# Patient Record
Sex: Male | Born: 1940 | Race: White | Hispanic: No | Marital: Married | Smoking: Former smoker
Health system: Southern US, Community
[De-identification: ages and names within clinical notes are randomized; demographics above are authoritative.]

## PROBLEM LIST (undated history)

## (undated) DIAGNOSIS — I10 Essential (primary) hypertension: Secondary | ICD-10-CM

## (undated) DIAGNOSIS — I639 Cerebral infarction, unspecified: Secondary | ICD-10-CM

## (undated) HISTORY — PX: HERNIA REPAIR: SHX51

## (undated) HISTORY — DX: Cerebral infarction, unspecified: I63.9

## (undated) HISTORY — DX: Essential (primary) hypertension: I10

---

## 2009-11-07 ENCOUNTER — Emergency Department (HOSPITAL_COMMUNITY): Admission: EM | Admit: 2009-11-07 | Discharge: 2009-11-07 | Payer: Self-pay | Admitting: Emergency Medicine

## 2010-10-21 LAB — URINALYSIS, ROUTINE W REFLEX MICROSCOPIC
Ketones, ur: 15 mg/dL — AB
Nitrite: POSITIVE — AB
Protein, ur: >300 mg/dL — AB
Urobilinogen, UA: 1 mg/dL (ref 0.0–1.0)
pH: 5.5 (ref 5.0–8.0)

## 2010-10-21 LAB — BASIC METABOLIC PANEL
Chloride: 108 mEq/L (ref 96–112)
Creatinine, Ser: 0.95 mg/dL (ref 0.4–1.5)
GFR calc Af Amer: >60 mL/min (ref >60)
Sodium: 141 mEq/L (ref 135–145)

## 2010-10-21 LAB — CBC
MCV: 87 fL (ref 78.0–100.0)
RBC: 5.14 MIL/uL (ref 4.22–5.81)
WBC: 6.1 10*3/uL (ref 4.0–10.5)

## 2010-10-21 LAB — URINE MICROSCOPIC-ADD ON

## 2012-01-10 DIAGNOSIS — R55 Syncope and collapse: Secondary | ICD-10-CM

## 2012-01-10 DIAGNOSIS — I2510 Atherosclerotic heart disease of native coronary artery without angina pectoris: Secondary | ICD-10-CM | POA: Insufficient documentation

## 2012-01-10 HISTORY — DX: Syncope and collapse: R55

## 2012-01-11 HISTORY — PX: CORONARY ARTERY BYPASS GRAFT: SHX141

## 2012-01-13 DIAGNOSIS — I462 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: Secondary | ICD-10-CM | POA: Insufficient documentation

## 2012-01-13 DIAGNOSIS — I4891 Unspecified atrial fibrillation: Secondary | ICD-10-CM

## 2012-01-13 HISTORY — DX: 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: I46.2

## 2012-01-13 HISTORY — DX: Unspecified atrial fibrillation: I48.91

## 2012-02-21 DIAGNOSIS — I429 Cardiomyopathy, unspecified: Secondary | ICD-10-CM

## 2012-02-21 HISTORY — DX: Cardiomyopathy, unspecified: I42.9

## 2013-12-18 DIAGNOSIS — I10 Essential (primary) hypertension: Secondary | ICD-10-CM | POA: Insufficient documentation

## 2013-12-18 DIAGNOSIS — E785 Hyperlipidemia, unspecified: Secondary | ICD-10-CM | POA: Insufficient documentation

## 2014-03-02 DEATH — deceased

## 2016-03-27 DIAGNOSIS — R42 Dizziness and giddiness: Secondary | ICD-10-CM | POA: Insufficient documentation

## 2016-04-21 ENCOUNTER — Telehealth: Payer: Self-pay

## 2016-04-22 NOTE — Telephone Encounter (Signed)
x

## 2019-08-27 ENCOUNTER — Other Ambulatory Visit: Payer: Self-pay

## 2019-08-27 ENCOUNTER — Ambulatory Visit (INDEPENDENT_AMBULATORY_CARE_PROVIDER_SITE_OTHER): Payer: Medicare Other | Admitting: Family Medicine

## 2019-08-27 ENCOUNTER — Encounter: Payer: Self-pay | Admitting: Family Medicine

## 2019-08-27 VITALS — BP 136/78 | HR 96 | Temp 98.2°F | Resp 20 | Ht 72.0 in | Wt 148.0 lb

## 2019-08-27 DIAGNOSIS — I480 Paroxysmal atrial fibrillation: Secondary | ICD-10-CM

## 2019-08-27 DIAGNOSIS — I2510 Atherosclerotic heart disease of native coronary artery without angina pectoris: Secondary | ICD-10-CM | POA: Diagnosis not present

## 2019-08-27 DIAGNOSIS — R351 Nocturia: Secondary | ICD-10-CM

## 2019-08-27 DIAGNOSIS — I10 Essential (primary) hypertension: Secondary | ICD-10-CM | POA: Diagnosis not present

## 2019-08-27 DIAGNOSIS — E782 Mixed hyperlipidemia: Secondary | ICD-10-CM

## 2019-08-27 DIAGNOSIS — N401 Enlarged prostate with lower urinary tract symptoms: Secondary | ICD-10-CM

## 2019-08-27 NOTE — Progress Notes (Signed)
Subjective:  Patient ID: Stephen Cross, male    DOB: Jan 31, 1941, 79 y.o.   MRN: [id number]  Patient Care Team: Baruch Gouty, FNP as PCP - General (Family Medicine)   Chief Complaint:  Establish Care ([place] )   HPI: Stephen Cross is a 79 y.o. male presenting on 08/27/2019 for Establish Care ([place] )   79 year old male presents today to establish care with new provider as his provider has retired.  Patient reports he is fairly healthy.  States he only takes pravastatin and aspirin daily.  Upon review of EHR, it is noted that patient has had a four-vessel CABG, Fr[place] coronary artery disease, hyperlipidemia enlarged prostate, cardiomyopathy, hypertension, atrial fibrillation, GERD, and intermittent dizziness.  Patient reports he has not seen cardiology in over a year.  States he was released by them.  Last cardiology note reported patient was to be on beta-blockers, statin therapy, and aspirin therapy.  Patient reports he has not taken anything for his blood pressure in a long time as it has not been required.  Patient is followed by the [facility].  Does see them on a regular basis.  No notes available from the [facility].  Patient states he does have intermittent dizziness.  States this occurs with quick positional changes.  States it only lasts for a brief second and then resolves.  He reports he has been on meclizine in the past for this with great control of symptoms.  He denies any chest pain, shortness of breath, weakness, confusion, headaches, focal deficits, or loss of function.  No syncopal episodes.  No palpitations or fatigue.  Patient's heart rate and blood pressure noted to be elevated in office today with some irregularities to his heart rate.  Patient reports he does not feel the irregularity in his heart rate and denies symptoms.  Patient does report he does drink excessive amounts of caffeine throughout the day.  States he drinks at least 8 to 10 cups of coffee per day.  Denies other  stimulant use.   Relevant past medical, surgical, family, and social history reviewed and updated as indicated.  Allergies and medications reviewed and updated. Date reviewed: Chart in Epic.   Past Medical History:  Diagnosis Date  . 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 ([place]) 01/13/2012   Overview:  PEA arrest on induction  . Syncope, cardiogenic 01/10/2012    Past Surgical History:  Procedure Laterality Date  . HERNIA REPAIR     x 2    Social History   Socioeconomic History  . Marital status: Married    Spouse name: patricia  . Number of children: 4  . Years of education: Not on file  . Highest education level: Not on file  Occupational History  . Occupation: retired   Tobacco Use  . Smoking status: Former Research scientist (life sciences)  . Smokeless tobacco: Never Used  Substance and Sexual Activity  . Alcohol use: Never  . Drug use: Never  . Sexual activity: Not on file  Other Topics Concern  . Not on file  Social History Narrative  . Not on file   Social Determinants of Health   Financial Resource Strain:   . Difficulty of Paying Living Expenses: Not on file  Food Insecurity:   . Worried About Charity fundraiser in the Last Year: Not on file  . Ran Out of Food in the Last Year: Not on file  Transportation Needs:   . Lack of  Transportation (Medical): Not on file  . Lack of Transportation (Non-Medical): Not on file  Physical Activity:   . Days of Exercise per Week: Not on file  . Minutes of Exercise per Session: Not on file  Stress:   . Feeling of Stress : Not on file  Social Connections:   . Frequency of Communication with Friends and Family: Not on file  . Frequency of Social Gatherings with Friends and Family: Not on file  . Attends Religious Services: Not on file  . Active Member of Clubs or Organizations: Not on file  . Attends Archivist Meetings: Not on file  . Marital Status: Not on file  Intimate Partner Violence:   . Fear of Current or  Ex-Partner: Not on file  . Emotionally Abused: Not on file  . Physically Abused: Not on file  . Sexually Abused: Not on file    Outpatient Encounter Medications as of 08/27/2019  Medication Sig  . pravastatin (PRAVACHOL) 80 MG tablet Take 1 tablet by mouth daily.  Marland Kitchen aspirin 81 MG EC tablet Take 1 tablet by mouth daily.  . meclizine (ANTIVERT) 25 MG tablet Take 25 mg by mouth 3 (three) times daily as needed for dizziness.   No facility-administered encounter medications on file as of 08/27/2019.    Allergies  Allergen Reactions  . Codeine Nausea Only and Rash  . Lisinopril Cough and Other (See Comments)    Review of Systems  Constitutional: Negative for activity change, appetite change, chills, diaphoresis, fatigue, fever and unexpected weight change.  HENT: Negative.   Eyes: Negative.  Negative for photophobia and visual disturbance.  Respiratory: Negative for cough, chest tightness, shortness of breath and wheezing.   Cardiovascular: Negative for chest pain, palpitations and leg swelling.  Gastrointestinal: Negative for abdominal pain, anal bleeding, blood in stool, constipation, diarrhea, nausea and vomiting.  Endocrine: Negative.  Negative for polydipsia, polyphagia and polyuria.  Genitourinary: Negative for decreased urine volume, difficulty urinating, discharge, dysuria, enuresis, flank pain, frequency, genital sores, hematuria, penile pain, penile swelling, scrotal swelling, testicular pain and urgency.       Nocturia  Musculoskeletal: Negative for arthralgias and myalgias.  Skin: Negative.  Negative for color change and rash.  Allergic/Immunologic: Negative.   Neurological: Positive for dizziness. Negative for tremors, seizures, syncope, facial asymmetry, speech difficulty, weakness, light-headedness, numbness and headaches.  Hematological: Negative.   Psychiatric/Behavioral: Negative for confusion, hallucinations, sleep disturbance and suicidal ideas.  All other systems  reviewed and are negative.       Objective:  BP 136/78 (BP Location: Left Arm, Cuff Size: Normal)   Pulse 96   Temp 98.2 F (36.8 C)   Resp 20   Ht 6' (1.829 m)   Wt 148 lb (67.1 kg)   SpO2 94%   BMI 20.07 kg/m    Wt Readings from Last 3 Encounters:  08/27/19 148 lb (67.1 kg)    Physical Exam Vitals and nursing note reviewed.  Constitutional:      General: He is not in acute distress.    Appearance: Normal appearance. He is well-developed, well-groomed and normal weight. He is not ill-appearing, toxic-appearing or diaphoretic.  HENT:     Head: Normocephalic and atraumatic.     Jaw: There is normal jaw occlusion.     Right Ear: Hearing, tympanic membrane, ear canal and external ear normal.     Left Ear: Hearing, tympanic membrane, ear canal and external ear normal.     Nose: Nose normal.  Mouth/Throat:     Lips: Pink.     Mouth: Mucous membranes are moist.     Pharynx: Oropharynx is clear. Uvula midline.  Eyes:     General: Lids are normal.     Extraocular Movements: Extraocular movements intact.     Right eye: No nystagmus.     Left eye: No nystagmus.     Conjunctiva/sclera: Conjunctivae normal.     Pupils: Pupils are equal, round, and reactive to light.  Neck:     Thyroid: No thyroid mass, thyromegaly or thyroid tenderness.     Vascular: No carotid bruit or JVD.     Trachea: Trachea and phonation normal.  Cardiovascular:     Rate and Rhythm: Normal rate. Occasional extrasystoles are present.    Chest Wall: PMI is not displaced.     Pulses: Normal pulses.     Heart sounds: Normal heart sounds. No murmur. No friction rub. No gallop.   Pulmonary:     Effort: Pulmonary effort is normal. No respiratory distress.     Breath sounds: Normal breath sounds. No wheezing.  Abdominal:     General: Bowel sounds are normal. There is no distension or abdominal bruit.     Palpations: Abdomen is soft. There is no hepatomegaly or splenomegaly.     Tenderness: There is no  abdominal tenderness. There is no right CVA tenderness or left CVA tenderness.     Hernia: No hernia is present.  Musculoskeletal:        General: Normal range of motion.     Cervical back: Normal range of motion and neck supple.     Right lower leg: No edema.     Left lower leg: No edema.  Lymphadenopathy:     Cervical: No cervical adenopathy.  Skin:    General: Skin is warm and dry.     Capillary Refill: Capillary refill takes less than 2 seconds.     Coloration: Skin is not cyanotic, jaundiced or pale.     Findings: No rash.  Neurological:     General: No focal deficit present.     Mental Status: He is alert and oriented to person, place, and time.     Cranial Nerves: Cranial nerves are intact. No cranial nerve deficit.     Sensory: Sensation is intact. No sensory deficit.     Motor: Motor function is intact. No weakness.     Coordination: Coordination is intact. Coordination normal.     Gait: Gait is intact. Gait normal.     Deep Tendon Reflexes: Reflexes are normal and symmetric. Reflexes normal.  Psychiatric:        Attention and Perception: Attention and perception normal.        Mood and Affect: Mood and affect normal.        Speech: Speech normal.        Behavior: Behavior normal. Behavior is cooperative.        Thought Content: Thought content normal.        Cognition and Memory: Cognition and memory normal.        Judgment: Judgment normal.     Results for orders placed or performed during the hospital encounter of 11/07/09  Urinalysis, Routine w reflex microscopic  Result Value Ref Range   Color, Urine RED BIOCHEMICALS MAY BE AFFECTED BY COLOR (A) YELLOW   APPearance CLOUDY (A) CLEAR   Specific Gravity, Urine 1.025 1.005 - 1.030   pH 5.5 5.0 - 8.0   Glucose, UA NEGATIVE  NEGATIVE mg/dL   Hgb urine dipstick LARGE (A) NEGATIVE   Bilirubin Urine SMALL (A) NEGATIVE   Ketones, ur 15 (A) NEGATIVE mg/dL   Protein, ur >300 (A) NEGATIVE mg/dL   Urobilinogen, UA 1.0 0.0  - 1.0 mg/dL   Nitrite POSITIVE (A) NEGATIVE   Leukocytes, UA TRACE (A) NEGATIVE  Urine microscopic-add on  Result Value Ref Range   Squamous Epithelial / LPF RARE RARE   WBC, UA 0-2 <3 WBC/hpf   RBC / HPF TOO NUMEROUS TO COUNT <3 RBC/hpf   Bacteria, UA FEW (A) RARE   Casts HYALINE CASTS (A) NEGATIVE   Urine-Other      MUCOUS PRESENT URINALYSIS PERFORMED ON SUPERNATANT  Basic metabolic panel  Result Value Ref Range   Sodium 141 135 - 145 mEq/L   Potassium 4.5 3.5 - 5.1 mEq/L   Chloride 108 96 - 112 mEq/L   CO2 28 19 - 32 mEq/L   Glucose, Bld 103 (H) 70 - 99 mg/dL   BUN 19 6 - 23 mg/dL   Creatinine, Ser 0.95 0.4 - 1.5 mg/dL   Calcium 8.9 8.4 - 10.5 mg/dL   GFR calc non Af Amer >60 >60 mL/min   GFR calc Af Amer  >60 mL/min    >60        The eGFR has been calculated using the MDRD equation. This calculation has not been validated in all clinical situations. eGFR's persistently <60 mL/min signify possible Chronic Kidney Disease.  CBC  Result Value Ref Range   WBC 6.1 4.0 - 10.5 K/uL   RBC 5.14 4.22 - 5.81 MIL/uL   Hemoglobin 15.1 13.0 - 17.0 g/dL   HCT 44.8 39.0 - 52.0 %   MCV 87.0 78.0 - 100.0 fL   MCHC 33.7 30.0 - 36.0 g/dL   RDW 13.8 11.5 - 15.5 %   Platelets 186 150 - 400 K/uL    EKG: Sinus rhythm with PVCs.  No ST depression or elevation.  No previous EKG for comparison. Monia Pouch, FNP-C  Pertinent labs & imaging results that were available during my care of the patient were reviewed by me and considered in my medical decision making.  Assessment & Plan:  Stephen Cross was seen today for establish care.  Diagnoses and all orders for this visit:  Mixed hyperlipidemia Diet encouraged - increase intake of fresh fruits and vegetables, increase intake of lean proteins. Bake, broil, or grill foods. Avoid fried, greasy, and fatty foods. Avoid fast foods. Increase intake of fiber-rich whole grains. Exercise encouraged - at least 150 minutes per week and advance as  tolerated.  Goal BMI < 25. Continue medications as prescribed. Follow up in 3-6 months as discussed.  -     Lipid panel  Essential hypertension Initial blood pressure in office elevated at 174/88.  Manual recheck 136/78.  Patient reports normal blood pressure readings at home.  Patient states he is asymptomatic.  Will reevaluate and discuss initiating medications if blood pressure remains elevated.  Labs pending. -     CMP14+EGFR -     CBC with Differential/Platelet -     Lipid panel -     Thyroid Panel With TSH  Chronic coronary artery disease Labs pending.  Patient refused referral to cardiology at this time.  States he will speak to the doctors at the [facility] about this. -     CMP14+EGFR -     CBC with Differential/Platelet -     Lipid panel  Paroxysmal atrial fibrillation (HCC) EKG  in office with PVCs.  Patient asymptomatic.  Patient advised to limit caffeine intake.  Patient aware of symptoms that quired emergent evaluation and treatment.  Patient aware to report any new, worsening, or persistent symptoms.  Labs pending. -     EKG 12-Lead -     CMP14+EGFR -     CBC with Differential/Platelet  Benign prostatic hyperplasia with nocturia Has had an elevated PSA in the past.  We will recheck today. -     PSA, total and free     Continue all other maintenance medications.  Follow up plan: Return in about 6 months (around 02/24/2020), or if symptoms worsen or fail to improve, for Hyperlipidemia, HTN.  Continue healthy lifestyle choices, including diet (rich in fruits, vegetables, and lean proteins, and low in salt and simple carbohydrates) and exercise (at least 30 minutes of moderate physical activity daily).  Educational handout given for survey and COVID-19  The above assessment and management plan was discussed with the patient. The patient verbalized understanding of and has agreed to the management plan. Patient is aware to call the clinic if they develop any new symptoms or if  symptoms persist or worsen. Patient is aware when to return to the clinic for a follow-up visit. Patient educated on when it is appropriate to go to the emergency department.   Monia Pouch, FNP-C [facility] [phone]

## 2019-08-27 NOTE — Patient Instructions (Signed)
It was a pleasure seeing you today, Stephen Cross.  Information regarding what we discussed is included in this packet.  Please make an appointment to see me in 6 months.    If you had labs performed today, you will be contacted with the abnormal results once they are available, usually in the next 3 business days for routine lab work.  If you had STI testing, a pap smear, or a biopsy performed, expect to be contacted in about 7-10 days. If results are normal, you will not be notified.    In a few days you may receive a survey in the mail or online from [employer] regarding your visit with [place] today. Please take a moment to fill this out. Your feedback is very important to our office. It can help [place] better understand your needs as well as improve your experience and satisfaction. Thank you for taking your time to complete it. We care about you.  Because of recent events of COVID-19 ("Coronavirus"), please follow CDC recommendations:   1. Wash your hand frequently 2. Avoid touching your face 3. Stay away from people who are sick 4. If you have symptoms such as fever, cough, shortness of breath then call your healthcare provider for further guidance 5. If you are sick, STAY AT HOME, unless otherwise directed by your healthcare provider. 6. Follow directions from state and national officials regarding staying safe    Please feel free to call our office if any questions or concerns arise.  Warm Regards, Kari Baars, FNP-C [facility] [address] [place], [place] [address] [phone]

## 2019-08-28 ENCOUNTER — Telehealth: Payer: Self-pay | Admitting: Family Medicine

## 2019-08-28 ENCOUNTER — Telehealth: Payer: Self-pay | Admitting: *Deleted

## 2019-08-28 LAB — PSA, TOTAL AND FREE
PSA, Free Pct: 39.1 %
PSA, Free: 1.84 ng/mL
Prostate Specific Ag, Serum: 4.7 ng/mL — ABNORMAL HIGH (ref 0.0–4.0)

## 2019-08-28 LAB — CBC WITH DIFFERENTIAL/PLATELET
Basophils Absolute: 0.1 10*3/uL (ref 0.0–0.2)
Basos: 1 %
EOS (ABSOLUTE): 0.8 10*3/uL — ABNORMAL HIGH (ref 0.0–0.4)
Eos: 9 %
Hematocrit: 47.3 % (ref 37.5–51.0)
Hemoglobin: 16.1 g/dL (ref 13.0–17.7)
Immature Grans (Abs): 0.1 10*3/uL (ref 0.0–0.1)
Immature Granulocytes: 1 %
Lymphocytes Absolute: 1.5 10*3/uL (ref 0.7–3.1)
Lymphs: 17 %
MCH: 28.8 pg (ref 26.6–33.0)
MCHC: 34 g/dL (ref 31.5–35.7)
MCV: 85 fL (ref 79–97)
Monocytes Absolute: 0.9 10*3/uL (ref 0.1–0.9)
Monocytes: 10 %
Neutrophils Absolute: 5.7 10*3/uL (ref 1.4–7.0)
Neutrophils: 62 %
Platelets: 214 10*3/uL (ref 150–450)
RBC: 5.59 x10E6/uL (ref 4.14–5.80)
RDW: 12.5 % (ref 11.6–15.4)
WBC: 9.2 10*3/uL (ref 3.4–10.8)

## 2019-08-28 LAB — CMP14+EGFR
ALT: 6 IU/L (ref 0–44)
AST: 13 IU/L (ref 0–40)
Albumin/Globulin Ratio: 1.7 (ref 1.2–2.2)
Albumin: 4.5 g/dL (ref 3.7–4.7)
Alkaline Phosphatase: 84 IU/L (ref 39–117)
BUN/Creatinine Ratio: 19 (ref 10–24)
BUN: 18 mg/dL (ref 8–27)
Bilirubin Total: 0.5 mg/dL (ref 0.0–1.2)
CO2: 25 mmol/L (ref 20–29)
Calcium: 9.6 mg/dL (ref 8.6–10.2)
Chloride: 104 mmol/L (ref 96–106)
Creatinine, Ser: 0.94 mg/dL (ref 0.76–1.27)
GFR calc Af Amer: 89 mL/min/{1.73_m2} (ref >59)
GFR calc non Af Amer: 77 mL/min/{1.73_m2} (ref >59)
Globulin, Total: 2.6 g/dL (ref 1.5–4.5)
Glucose: 99 mg/dL (ref 65–99)
Potassium: 5.1 mmol/L (ref 3.5–5.2)
Sodium: 144 mmol/L (ref 134–144)
Total Protein: 7.1 g/dL (ref 6.0–8.5)

## 2019-08-28 LAB — LIPID PANEL
Chol/HDL Ratio: 3.4 ratio (ref 0.0–5.0)
Cholesterol, Total: 154 mg/dL (ref 100–199)
HDL: 45 mg/dL (ref >39)
LDL Chol Calc (NIH): 93 mg/dL (ref 0–99)
Triglycerides: 82 mg/dL (ref 0–149)
VLDL Cholesterol Cal: 16 mg/dL (ref 5–40)

## 2019-08-28 LAB — THYROID PANEL WITH TSH
Free Thyroxine Index: 1.6 (ref 1.2–4.9)
T3 Uptake Ratio: 22 % — ABNORMAL LOW (ref 24–39)
T4, Total: 7.2 ug/dL (ref 4.5–12.0)
TSH: 2.36 u[IU]/mL (ref 0.450–4.500)

## 2019-08-28 MED ORDER — PRAVASTATIN SODIUM 80 MG PO TABS: 80.0000 mg | ORAL_TABLET | Freq: Every day | ORAL | 1 refills | Status: DC

## 2019-08-28 NOTE — Telephone Encounter (Signed)
done

## 2019-08-28 NOTE — Telephone Encounter (Signed)
Per lab note patient notified and verbalized understanding

## 2019-11-14 ENCOUNTER — Encounter: Payer: Self-pay | Admitting: *Deleted

## 2020-02-25 ENCOUNTER — Other Ambulatory Visit: Payer: Self-pay

## 2020-02-25 ENCOUNTER — Ambulatory Visit: Payer: Medicare Other | Admitting: Family Medicine

## 2020-02-25 ENCOUNTER — Encounter: Payer: Self-pay | Admitting: Family Medicine

## 2020-02-25 ENCOUNTER — Ambulatory Visit (INDEPENDENT_AMBULATORY_CARE_PROVIDER_SITE_OTHER): Payer: Medicare Other | Admitting: Family Medicine

## 2020-02-25 VITALS — BP 140/84 | HR 83 | Temp 97.1°F | Ht 73.0 in | Wt 139.6 lb

## 2020-02-25 DIAGNOSIS — I2510 Atherosclerotic heart disease of native coronary artery without angina pectoris: Secondary | ICD-10-CM

## 2020-02-25 DIAGNOSIS — R42 Dizziness and giddiness: Secondary | ICD-10-CM

## 2020-02-25 DIAGNOSIS — N401 Enlarged prostate with lower urinary tract symptoms: Secondary | ICD-10-CM | POA: Diagnosis not present

## 2020-02-25 DIAGNOSIS — I10 Essential (primary) hypertension: Secondary | ICD-10-CM

## 2020-02-25 DIAGNOSIS — R351 Nocturia: Secondary | ICD-10-CM

## 2020-02-25 DIAGNOSIS — E782 Mixed hyperlipidemia: Secondary | ICD-10-CM | POA: Diagnosis not present

## 2020-02-25 DIAGNOSIS — H04201 Unspecified epiphora, right lacrimal gland: Secondary | ICD-10-CM

## 2020-02-25 DIAGNOSIS — Z23 Encounter for immunization: Secondary | ICD-10-CM

## 2020-02-25 MED ORDER — MECLIZINE HCL 25 MG PO TABS: 25.0000 mg | ORAL_TABLET | Freq: Three times a day (TID) | ORAL | 2 refills | Status: DC | PRN

## 2020-02-25 MED ORDER — TAMSULOSIN HCL 0.4 MG PO CAPS: 0.4000 mg | ORAL_CAPSULE | Freq: Every day | ORAL | 3 refills | Status: DC

## 2020-02-25 MED ORDER — PRAVASTATIN SODIUM 80 MG PO TABS: 80.0000 mg | ORAL_TABLET | Freq: Every day | ORAL | 1 refills | Status: DC

## 2020-02-25 MED ORDER — SHINGRIX 50 MCG/0.5ML IM SUSR: 0.5000 mL | Freq: Once | INTRAMUSCULAR | 0 refills | Status: AC

## 2020-02-25 MED ORDER — SHINGRIX 50 MCG/0.5ML IM SUSR: 0.5000 mL | Freq: Once | INTRAMUSCULAR | 0 refills | Status: DC

## 2020-02-25 NOTE — Progress Notes (Signed)
Assessment & Plan:  1. Benign prostatic hyperplasia with nocturia - Uncontrolled. Started on Flomax.  - PSA, total and free - tamsulosin (FLOMAX) 0.4 MG CAPS capsule; Take 1 capsule (0.4 mg total) by mouth daily with supper.  Dispense: 30 capsule; Refill: 3  2. Essential hypertension - Well controlled without medication. Education provided on the DASH diet.  - CBC with Differential/Platelet - CMP14+EGFR - Lipid panel  3. Mixed hyperlipidemia - Well controlled on current regimen.  - pravastatin (PRAVACHOL) 80 MG tablet; Take 1 tablet (80 mg total) by mouth daily.  Dispense: 90 tablet; Refill: 1 - CBC with Differential/Platelet - CMP14+EGFR - Lipid panel  4. Chronic coronary artery disease - On statin therapy.   5. Dizziness - Well controlled on current regimen.  - meclizine (ANTIVERT) 25 MG tablet; Take 1 tablet (25 mg total) by mouth 3 (three) times daily as needed for dizziness.  Dispense: 30 tablet; Refill: 2 - CMP14+EGFR  6. Watering of right eye - Encouraged to schedule eye exam.    Return in about 6 months (around 08/27/2020) for follow-up of chronic medication conditions.  Hendricks Limes, MSN, APRN, FNP-C [facility]  Subjective:    Patient ID: Stephen Cross, male    DOB: November 05, 1940, 79 y.o.   MRN: [id number]  Patient Care Team: Loman Brooklyn, FNP as PCP - General (Family Medicine)   Chief Complaint:  Chief Complaint  Patient presents with  . Establish Care    [place]  . Hypertension    check up of chronic medical conditions  . Hyperlipidemia    HPI: Stephen Cross is a 79 y.o. male presenting on 02/25/2020 for Establish Care ([place]), Hypertension (check up of chronic medical conditions), and Hyperlipidemia  Hypertension: Patient here for follow-up of elevated blood pressure. He is exercising and is not adherent to low salt diet.  Blood pressure is well controlled at home (811-914N systolic). Cardiac symptoms none. Cardiovascular risk  factors: advanced age (older than 56 for men, 38 for women), dyslipidemia, hypertension, male gender and former tobacco user. Use of agents associated with hypertension: none. History of target organ damage: angina/ prior myocardial infarction.  Benign Prostatic Hypertrophy: Patient complains of lower urinary tract symptoms, including nocturia. Patient reports he has seen urology in the past - last time a year ago. He has had symptoms x20 years. Reports his PSA goes up and down over the years.    New complaints: Patient c/o a right watery eye that he feels is related to cataracts. He has not seen an eye doctor.  Social history:  Relevant past medical, surgical, family and social history reviewed and updated as indicated. Interim medical history since our last visit reviewed.  Allergies and medications reviewed and updated.  DATA REVIEWED: CHART IN EPIC  ROS: Negative unless specifically indicated above in HPI.    Current Outpatient Medications:  .  aspirin 81 MG EC tablet, Take 1 tablet by mouth daily., Disp: , Rfl:  .  meclizine (ANTIVERT) 25 MG tablet, Take 25 mg by mouth 3 (three) times daily as needed for dizziness., Disp: , Rfl:  .  pravastatin (PRAVACHOL) 80 MG tablet, Take 1 tablet (80 mg total) by mouth daily., Disp: 90 tablet, Rfl: 1   Allergies  Allergen Reactions  . Codeine Nausea Only and Rash  . Lisinopril Cough and Other (See Comments)   Past Medical History:  Diagnosis Date  . 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 ([place]) 01/13/2012   Overview:  PEA arrest on induction  . Syncope, cardiogenic 01/10/2012    Past Surgical History:  Procedure Laterality Date  . HERNIA REPAIR     x 2    Social History   Socioeconomic History  . Marital status: Married    Spouse name: patricia  . Number of children: 4  . Years of education: Not on file  . Highest education level: Not on file  Occupational History  . Occupation: retired   Tobacco Use  . Smoking  status: Former Research scientist (life sciences)  . Smokeless tobacco: Never Used  Vaping Use  . Vaping Use: Never used  Substance and Sexual Activity  . Alcohol use: Never  . Drug use: Never  . Sexual activity: Not on file  Other Topics Concern  . Not on file  Social History Narrative  . Not on file   Social Determinants of Health   Financial Resource Strain:   . Difficulty of Paying Living Expenses:   Food Insecurity:   . Worried About Charity fundraiser in the Last Year:   . Arboriculturist in the Last Year:   Transportation Needs:   . Film/video editor (Medical):   Marland Kitchen Lack of Transportation (Non-Medical):   Physical Activity:   . Days of Exercise per Week:   . Minutes of Exercise per Session:   Stress:   . Feeling of Stress :   Social Connections:   . Frequency of Communication with Friends and Family:   . Frequency of Social Gatherings with Friends and Family:   . Attends Religious Services:   . Active Member of Clubs or Organizations:   . Attends Archivist Meetings:   Marland Kitchen Marital Status:   Intimate Partner Violence:   . Fear of Current or Ex-Partner:   . Emotionally Abused:   Marland Kitchen Physically Abused:   . Sexually Abused:         Objective:    BP (!) 140/84 Comment: manual  Pulse 83   Temp (!) 97.1 F (36.2 C) (Temporal)   Ht 6' 1"  (1.854 m)   Wt 139 lb 9.6 oz (63.3 kg)   SpO2 97%   BMI 18.42 kg/m   Wt Readings from Last 3 Encounters:  02/25/20 139 lb 9.6 oz (63.3 kg)  08/27/19 148 lb (67.1 kg)    Physical Exam Vitals reviewed.  Constitutional:      General: He is not in acute distress.    Appearance: Normal appearance. He is underweight. He is not ill-appearing, toxic-appearing or diaphoretic.  HENT:     Head: Normocephalic and atraumatic.  Eyes:     General: No scleral icterus.       Right eye: No discharge.        Left eye: No discharge.     Conjunctiva/sclera: Conjunctivae normal.  Cardiovascular:     Rate and Rhythm: Normal rate and regular rhythm.      Heart sounds: Normal heart sounds. No murmur heard.  No friction rub. No gallop.   Pulmonary:     Effort: Pulmonary effort is normal. No respiratory distress.     Breath sounds: Normal breath sounds. No stridor. No wheezing, rhonchi or rales.  Musculoskeletal:        General: Normal range of motion.     Cervical back: Normal range of motion.  Skin:    General: Skin is warm and dry.  Neurological:     Mental Status: He is alert and oriented to person, place, and time. Mental status  is at baseline.  Psychiatric:        Mood and Affect: Mood normal.        Behavior: Behavior normal.        Thought Content: Thought content normal.        Judgment: Judgment normal.     Lab Results  Component Value Date   TSH 2.360 08/27/2019   Lab Results  Component Value Date   WBC 9.2 08/27/2019   HGB 16.1 08/27/2019   HCT 47.3 08/27/2019   MCV 85 08/27/2019   PLT 214 08/27/2019   Lab Results  Component Value Date   NA 144 08/27/2019   K 5.1 08/27/2019   CO2 25 08/27/2019   GLUCOSE 99 08/27/2019   BUN 18 08/27/2019   CREATININE 0.94 08/27/2019   BILITOT 0.5 08/27/2019   ALKPHOS 84 08/27/2019   AST 13 08/27/2019   ALT 6 08/27/2019   PROT 7.1 08/27/2019   ALBUMIN 4.5 08/27/2019   CALCIUM 9.6 08/27/2019   Lab Results  Component Value Date   CHOL 154 08/27/2019   Lab Results  Component Value Date   HDL 45 08/27/2019   Lab Results  Component Value Date   LDLCALC 93 08/27/2019   Lab Results  Component Value Date   TRIG 82 08/27/2019   Lab Results  Component Value Date   CHOLHDL 3.4 08/27/2019   No results found for: HGBA1C

## 2020-02-25 NOTE — Patient Instructions (Signed)
[facility] Phone: [phone]   DASH Eating Plan DASH stands for "Dietary Approaches to Stop Hypertension." The DASH eating plan is a healthy eating plan that has been shown to reduce high blood pressure (hypertension). It may also reduce your risk for type 2 diabetes, heart disease, and stroke. The DASH eating plan may also help with weight loss. What are tips for following this plan?  General guidelines  Avoid eating more than 2,300 mg (milligrams) of salt (sodium) a day. If you have hypertension, you may need to reduce your sodium intake to 1,500 mg a day.  Limit alcohol intake to no more than 1 drink a day for nonpregnant women and 2 drinks a day for men. One drink equals 12 oz of beer, 5 oz of wine, or 1 oz of hard liquor.  Work with your health care provider to maintain a healthy body weight or to lose weight. Ask what an ideal weight is for you.  Get at least 30 minutes of exercise that causes your heart to beat faster (aerobic exercise) most days of the week. Activities may include walking, swimming, or biking.  Work with your health care provider or diet and nutrition specialist (dietitian) to adjust your eating plan to your individual calorie needs. Reading food labels   Check food labels for the amount of sodium per serving. Choose foods with less than 5 percent of the Daily Value of sodium. Generally, foods with less than 300 mg of sodium per serving fit into this eating plan.  To find whole grains, look for the word "whole" as the first word in the ingredient list. Shopping  Buy products labeled as "low-sodium" or "no salt added."  Buy fresh foods. Avoid canned foods and premade or frozen meals. Cooking  Avoid adding salt when cooking. Use salt-free seasonings or herbs instead of table salt or sea salt. Check with your health care provider or pharmacist before using salt substitutes.  Do not fry foods. Cook foods using healthy methods such as baking,  boiling, grilling, and broiling instead.  Cook with heart-healthy oils, such as olive, canola, soybean, or sunflower oil. Meal planning  Eat a balanced diet that includes: ? 5 or more servings of fruits and vegetables each day. At each meal, try to fill half of your plate with fruits and vegetables. ? Up to 6-8 servings of whole grains each day. ? Less than 6 oz of lean meat, poultry, or fish each day. A 3-oz serving of meat is about the same size as a deck of cards. One egg equals 1 oz. ? 2 servings of low-fat dairy each day. ? A serving of nuts, seeds, or beans 5 times each week. ? Heart-healthy fats. Healthy fats called Omega-3 fatty acids are found in foods such as flaxseeds and coldwater fish, like sardines, salmon, and mackerel.  Limit how much you eat of the following: ? Canned or prepackaged foods. ? Food that is high in trans fat, such as fried foods. ? Food that is high in saturated fat, such as fatty meat. ? Sweets, desserts, sugary drinks, and other foods with added sugar. ? Full-fat dairy products.  Do not salt foods before eating.  Try to eat at least 2 vegetarian meals each week.  Eat more home-cooked food and less restaurant, buffet, and fast food.  When eating at a restaurant, ask that your food be prepared with less salt or no salt, if possible. What foods are recommended? The items listed may not  be a complete list. Talk with your dietitian about what dietary choices are best for you. Grains Whole-grain or whole-wheat bread. Whole-grain or whole-wheat pasta. Brown rice. [place]. Bulgur. Whole-grain and low-sodium cereals. Pita bread. Low-fat, low-sodium crackers. Whole-wheat flour tortillas. Vegetables Fresh or frozen vegetables (raw, steamed, roasted, or grilled). Low-sodium or reduced-sodium tomato and vegetable juice. Low-sodium or reduced-sodium tomato sauce and tomato paste. Low-sodium or reduced-sodium canned vegetables. Fruits All fresh, dried, or  frozen fruit. Canned fruit in natural juice (without added sugar). Meat and other protein foods Skinless chicken or [place]. Ground chicken or [place]. Pork with fat trimmed off. Fish and seafood. Egg whites. Dried beans, peas, or lentils. Unsalted nuts, nut butters, and seeds. Unsalted canned beans. Lean cuts of beef with fat trimmed off. Low-sodium, lean deli meat. Dairy Low-fat (1%) or fat-free (skim) milk. Fat-free, low-fat, or reduced-fat cheeses. Nonfat, low-sodium ricotta or cottage cheese. Low-fat or nonfat yogurt. Low-fat, low-sodium cheese. Fats and oils Soft margarine without trans fats. Vegetable oil. Low-fat, reduced-fat, or light mayonnaise and salad dressings (reduced-sodium). Canola, safflower, olive, soybean, and sunflower oils. Avocado. Seasoning and other foods Herbs. Spices. Seasoning mixes without salt. Unsalted popcorn and pretzels. Fat-free sweets. What foods are not recommended? The items listed may not be a complete list. Talk with your dietitian about what dietary choices are best for you. Grains Baked goods made with fat, such as croissants, muffins, or some breads. Dry pasta or rice meal packs. Vegetables Creamed or fried vegetables. Vegetables in a cheese sauce. Regular canned vegetables (not low-sodium or reduced-sodium). Regular canned tomato sauce and paste (not low-sodium or reduced-sodium). Regular tomato and vegetable juice (not low-sodium or reduced-sodium). Angie Fava. Olives. Fruits Canned fruit in a light or heavy syrup. Fried fruit. Fruit in cream or butter sauce. Meat and other protein foods Fatty cuts of meat. Ribs. Fried meat. Berniece Salines. Sausage. Bologna and other processed lunch meats. Salami. Fatback. Hotdogs. Bratwurst. Salted nuts and seeds. Canned beans with added salt. Canned or smoked fish. Whole eggs or egg yolks. Chicken or [place] with skin. Dairy Whole or 2% milk, cream, and half-and-half. Whole or full-fat cream cheese. Whole-fat or sweetened yogurt.  Full-fat cheese. Nondairy creamers. Whipped toppings. Processed cheese and cheese spreads. Fats and oils Butter. Stick margarine. Lard. Shortening. Ghee. Bacon fat. Tropical oils, such as coconut, palm kernel, or palm oil. Seasoning and other foods Salted popcorn and pretzels. Onion salt, garlic salt, seasoned salt, table salt, and sea salt. Worcestershire sauce. Tartar sauce. Barbecue sauce. Teriyaki sauce. Soy sauce, including reduced-sodium. Steak sauce. Canned and packaged gravies. Fish sauce. Oyster sauce. Cocktail sauce. Horseradish that you find on the shelf. Ketchup. Mustard. Meat flavorings and tenderizers. Bouillon cubes. Hot sauce and Tabasco sauce. Premade or packaged marinades. Premade or packaged taco seasonings. Relishes. Regular salad dressings. Where to find more information:  National Heart, Lung, and [place]: https://wilson-eaton.com/  American Heart Association: www.heart.org Summary  The DASH eating plan is a healthy eating plan that has been shown to reduce high blood pressure (hypertension). It may also reduce your risk for type 2 diabetes, heart disease, and stroke.  With the DASH eating plan, you should limit salt (sodium) intake to 2,300 mg a day. If you have hypertension, you may need to reduce your sodium intake to 1,500 mg a day.  When on the DASH eating plan, aim to eat more fresh fruits and vegetables, whole grains, lean proteins, low-fat dairy, and heart-healthy fats.  Work with your health care provider or diet and nutrition  specialist (dietitian) to adjust your eating plan to your individual calorie needs. This information is not intended to replace advice given to you by your health care provider. Make sure you discuss any questions you have with your health care provider. Document Revised: 07/01/2017 Document Reviewed: 07/12/2016 Elsevier Patient Education  2020 [employer].

## 2020-02-26 LAB — LIPID PANEL
Chol/HDL Ratio: 4 ratio (ref 0.0–5.0)
Cholesterol, Total: 168 mg/dL (ref 100–199)
HDL: 42 mg/dL (ref >39)
LDL Chol Calc (NIH): 103 mg/dL — ABNORMAL HIGH (ref 0–99)
Triglycerides: 131 mg/dL (ref 0–149)
VLDL Cholesterol Cal: 23 mg/dL (ref 5–40)

## 2020-02-26 LAB — CBC WITH DIFFERENTIAL/PLATELET
Basophils Absolute: 0.1 10*3/uL (ref 0.0–0.2)
Basos: 1 %
EOS (ABSOLUTE): 0.6 10*3/uL — ABNORMAL HIGH (ref 0.0–0.4)
Eos: 8 %
Hematocrit: 48.9 % (ref 37.5–51.0)
Hemoglobin: 16 g/dL (ref 13.0–17.7)
Immature Grans (Abs): 0 10*3/uL (ref 0.0–0.1)
Immature Granulocytes: 1 %
Lymphocytes Absolute: 1.2 10*3/uL (ref 0.7–3.1)
Lymphs: 16 %
MCH: 28.1 pg (ref 26.6–33.0)
MCHC: 32.7 g/dL (ref 31.5–35.7)
MCV: 86 fL (ref 79–97)
Monocytes Absolute: 0.8 10*3/uL (ref 0.1–0.9)
Monocytes: 11 %
Neutrophils Absolute: 4.6 10*3/uL (ref 1.4–7.0)
Neutrophils: 63 %
Platelets: 193 10*3/uL (ref 150–450)
RBC: 5.7 x10E6/uL (ref 4.14–5.80)
RDW: 13.7 % (ref 11.6–15.4)
WBC: 7.2 10*3/uL (ref 3.4–10.8)

## 2020-02-26 LAB — CMP14+EGFR
ALT: 7 IU/L (ref 0–44)
AST: 10 IU/L (ref 0–40)
Albumin/Globulin Ratio: 1.8 (ref 1.2–2.2)
Albumin: 4.4 g/dL (ref 3.7–4.7)
Alkaline Phosphatase: 92 IU/L (ref 48–121)
BUN/Creatinine Ratio: 13 (ref 10–24)
BUN: 15 mg/dL (ref 8–27)
Bilirubin Total: 0.6 mg/dL (ref 0.0–1.2)
CO2: 24 mmol/L (ref 20–29)
Calcium: 9.7 mg/dL (ref 8.6–10.2)
Chloride: 104 mmol/L (ref 96–106)
Creatinine, Ser: 1.12 mg/dL (ref 0.76–1.27)
GFR calc Af Amer: 71 mL/min/{1.73_m2} (ref >59)
GFR calc non Af Amer: 62 mL/min/{1.73_m2} (ref >59)
Globulin, Total: 2.4 g/dL (ref 1.5–4.5)
Glucose: 100 mg/dL — ABNORMAL HIGH (ref 65–99)
Potassium: 5.5 mmol/L — ABNORMAL HIGH (ref 3.5–5.2)
Sodium: 142 mmol/L (ref 134–144)
Total Protein: 6.8 g/dL (ref 6.0–8.5)

## 2020-02-26 LAB — PSA, TOTAL AND FREE
PSA, Free Pct: 37.4 %
PSA, Free: 1.76 ng/mL
Prostate Specific Ag, Serum: 4.7 ng/mL — ABNORMAL HIGH (ref 0.0–4.0)

## 2020-04-23 ENCOUNTER — Ambulatory Visit (INDEPENDENT_AMBULATORY_CARE_PROVIDER_SITE_OTHER): Payer: Medicare Other

## 2020-04-23 ENCOUNTER — Other Ambulatory Visit: Payer: Self-pay

## 2020-04-23 DIAGNOSIS — Z23 Encounter for immunization: Secondary | ICD-10-CM

## 2020-04-23 NOTE — Progress Notes (Signed)
   Covid-19 Vaccination Clinic  Name:  DEDRICK HEFFNER    MRN: [id number] DOB: 06/04/1941  04/23/2020  Mr. Victory was observed post Covid-19 immunization for 15 minutes without incident. He was provided with Vaccine Information Sheet and instruction to access the V-Safe system.   Mr. Ose was instructed to call 911 with any severe reactions post vaccine: Marland Kitchen Difficulty breathing  . Swelling of face and throat  . A fast heartbeat  . A bad rash all over body  . Dizziness and weakness

## 2020-08-27 ENCOUNTER — Other Ambulatory Visit: Payer: Self-pay

## 2020-08-27 ENCOUNTER — Encounter: Payer: Self-pay | Admitting: Family Medicine

## 2020-08-27 ENCOUNTER — Ambulatory Visit (INDEPENDENT_AMBULATORY_CARE_PROVIDER_SITE_OTHER): Payer: Medicare Other | Admitting: Family Medicine

## 2020-08-27 VITALS — BP 143/79 | HR 104 | Temp 97.2°F | Ht 72.0 in | Wt 146.8 lb

## 2020-08-27 DIAGNOSIS — E875 Hyperkalemia: Secondary | ICD-10-CM

## 2020-08-27 DIAGNOSIS — E782 Mixed hyperlipidemia: Secondary | ICD-10-CM | POA: Diagnosis not present

## 2020-08-27 DIAGNOSIS — N401 Enlarged prostate with lower urinary tract symptoms: Secondary | ICD-10-CM

## 2020-08-27 DIAGNOSIS — R351 Nocturia: Secondary | ICD-10-CM

## 2020-08-27 DIAGNOSIS — I10 Essential (primary) hypertension: Secondary | ICD-10-CM

## 2020-08-27 LAB — CMP14+EGFR
ALT: 7 IU/L (ref 0–44)
AST: 14 IU/L (ref 0–40)
Albumin/Globulin Ratio: 1.6 (ref 1.2–2.2)
Albumin: 4.2 g/dL (ref 3.7–4.7)
Alkaline Phosphatase: 84 IU/L (ref 44–121)
BUN/Creatinine Ratio: 13 (ref 10–24)
BUN: 15 mg/dL (ref 8–27)
Bilirubin Total: 0.4 mg/dL (ref 0.0–1.2)
CO2: 22 mmol/L (ref 20–29)
Calcium: 9.4 mg/dL (ref 8.6–10.2)
Chloride: 104 mmol/L (ref 96–106)
Creatinine, Ser: 1.19 mg/dL (ref 0.76–1.27)
GFR calc Af Amer: 66 mL/min/{1.73_m2} (ref >59)
GFR calc non Af Amer: 57 mL/min/{1.73_m2} — ABNORMAL LOW (ref >59)
Globulin, Total: 2.6 g/dL (ref 1.5–4.5)
Glucose: 104 mg/dL — ABNORMAL HIGH (ref 65–99)
Potassium: 4.9 mmol/L (ref 3.5–5.2)
Sodium: 145 mmol/L — ABNORMAL HIGH (ref 134–144)
Total Protein: 6.8 g/dL (ref 6.0–8.5)

## 2020-08-27 LAB — LIPID PANEL
Chol/HDL Ratio: 4.4 ratio (ref 0.0–5.0)
Cholesterol, Total: 181 mg/dL (ref 100–199)
HDL: 41 mg/dL (ref >39)
LDL Chol Calc (NIH): 117 mg/dL — ABNORMAL HIGH (ref 0–99)
Triglycerides: 127 mg/dL (ref 0–149)
VLDL Cholesterol Cal: 23 mg/dL (ref 5–40)

## 2020-08-27 MED ORDER — TAMSULOSIN HCL 0.4 MG PO CAPS: 0.8000 mg | ORAL_CAPSULE | Freq: Every day | ORAL | 2 refills | Status: DC

## 2020-08-27 MED ORDER — PRAVASTATIN SODIUM 80 MG PO TABS: 80.0000 mg | ORAL_TABLET | Freq: Every day | ORAL | 1 refills | Status: DC

## 2020-08-27 NOTE — Patient Instructions (Signed)
Resume your Flomax at 0.4 mg x2 weeks, then increase to 0.8 mg.    Benign Prostatic Hyperplasia  Benign prostatic hyperplasia (BPH) is an enlarged prostate gland that is caused by the normal aging process and not by cancer. The prostate is a walnut-sized gland that is involved in the production of semen. It is located in front of the rectum and below the bladder. The bladder stores urine and the urethra is the tube that carries the urine out of the body. The prostate may get bigger as a man gets older. An enlarged prostate can press on the urethra. This can make it harder to pass urine. The build-up of urine in the bladder can cause infection. Back pressure and infection may progress to bladder damage and kidney (renal) failure. What are the causes? This condition is part of a normal aging process. However, not all men develop problems from this condition. If the prostate enlarges away from the urethra, urine flow will not be blocked. If it enlarges toward the urethra and compresses it, there will be problems passing urine. What increases the risk? This condition is more likely to develop in men over the age of 50 years. What are the signs or symptoms? Symptoms of this condition include:  Getting up often during the night to urinate.  Needing to urinate frequently during the day.  Difficulty starting urine flow.  Decrease in size and strength of your urine stream.  Leaking (dribbling) after urinating.  Inability to pass urine. This needs immediate treatment.  Inability to completely empty your bladder.  Pain when you pass urine. This is more common if there is also an infection.  Urinary tract infection (UTI). How is this diagnosed? This condition is diagnosed based on your medical history, a physical exam, and your symptoms. Tests will also be done, such as:  A post-void bladder scan. This measures any amount of urine that may remain in your bladder after you finish urinating.  A  digital rectal exam. In a rectal exam, your health care provider checks your prostate by putting a lubricated, gloved finger into your rectum to feel the back of your prostate gland. This exam detects the size of your gland and any abnormal lumps or growths.  An exam of your urine (urinalysis).  A prostate specific antigen (PSA) screening. This is a blood test used to screen for prostate cancer.  An ultrasound. This test uses sound waves to electronically produce a picture of your prostate gland. Your health care provider may refer you to a specialist in kidney and prostate diseases (urologist). How is this treated? Once symptoms begin, your health care provider will monitor your condition (active surveillance or watchful waiting). Treatment for this condition will depend on the severity of your condition. Treatment may include:  Observation and yearly exams. This may be the only treatment needed if your condition and symptoms are mild.  Medicines to relieve your symptoms, including: ? Medicines to shrink the prostate. ? Medicines to relax the muscle of the prostate.  Surgery in severe cases. Surgery may include: ? Prostatectomy. In this procedure, the prostate tissue is removed completely through an open incision or with a laparoscope or robotics. ? Transurethral resection of the prostate (TURP). In this procedure, a tool is inserted through the opening at the tip of the penis (urethra). It is used to cut away tissue of the inner core of the prostate. The pieces are removed through the same opening of the penis. This removes the blockage. ?  Transurethral incision (TUIP). In this procedure, small cuts are made in the prostate. This lessens the prostate's pressure on the urethra. ? Transurethral microwave thermotherapy (TUMT). This procedure uses microwaves to create heat. The heat destroys and removes a small amount of prostate tissue. ? Transurethral needle ablation (TUNA). This procedure uses  radio frequencies to destroy and remove a small amount of prostate tissue. ? Interstitial laser coagulation (ILC). This procedure uses a laser to destroy and remove a small amount of prostate tissue. ? Transurethral electrovaporization (TUVP). This procedure uses electrodes to destroy and remove a small amount of prostate tissue. ? Prostatic urethral lift. This procedure inserts an implant to push the lobes of the prostate away from the urethra. Follow these instructions at home:  Take over-the-counter and prescription medicines only as told by your health care provider.  Monitor your symptoms for any changes. Contact your health care provider with any changes.  Avoid drinking large amounts of liquid before going to bed or out in public.  Avoid or reduce how much caffeine or alcohol you drink.  Give yourself time when you urinate.  Keep all follow-up visits as told by your health care provider. This is important. Contact a health care provider if:  You have unexplained back pain.  Your symptoms do not get better with treatment.  You develop side effects from the medicine you are taking.  Your urine becomes very dark or has a bad smell.  Your lower abdomen becomes distended and you have trouble passing your urine. Get help right away if:  You have a fever or chills.  You suddenly cannot urinate.  You feel lightheaded, or very dizzy, or you faint.  There are large amounts of blood or clots in the urine.  Your urinary problems become hard to manage.  You develop moderate to severe low back or flank pain. The flank is the side of your body between the ribs and the hip. These symptoms may represent a serious problem that is an emergency. Do not wait to see if the symptoms will go away. Get medical help right away. Call your local emergency services (911 in the [place] Do not drive yourself to the hospital. Summary  Benign prostatic hyperplasia (BPH) is an enlarged prostate that  is caused by the normal aging process and not by cancer.  An enlarged prostate can press on the urethra. This can make it hard to pass urine.  This condition is part of a normal aging process and is more likely to develop in men over the age of 50 years.  Get help right away if you suddenly cannot urinate. This information is not intended to replace advice given to you by your health care provider. Make sure you discuss any questions you have with your health care provider. Document Revised: 03/27/2020 Document Reviewed: 03/27/2020 Elsevier Patient Education  2021 [employer].

## 2020-08-31 NOTE — Progress Notes (Signed)
Assessment & Plan:  1. Essential hypertension - Well controlled on current regimen.  - CMP14+EGFR - Lipid panel  2. Mixed hyperlipidemia - Labs to assess. - pravastatin (PRAVACHOL) 80 MG tablet; Take 1 tablet (80 mg total) by mouth daily.  Dispense: 90 tablet; Refill: 1 - CMP14+EGFR - Lipid panel  3. Benign prostatic hyperplasia with nocturia - Uncontrolled.  Flomax restarted with directions to take 0.4 mg x 2 weeks, then he may increase to 0.8 mg.  Education provided on BPH. - tamsulosin (FLOMAX) 0.4 MG CAPS capsule; Take 2 capsules (0.8 mg total) by mouth daily with supper.  Dispense: 60 capsule; Refill: 2  4. Hyperkalemia - Labs to assess. - CMP14+EGFR   Return in about 6 weeks (around 10/08/2020) for BPH.  Hendricks Limes, MSN, APRN, FNP-C [facility]  Subjective:    Patient ID: Stephen Cross, male    DOB: 17-Jan-1941, 80 y.o.   MRN: [id number]  Patient Care Team: Loman Brooklyn, FNP as PCP - General (Family Medicine)   Chief Complaint:  Chief Complaint  Patient presents with  . Benign Prostatic Hypertrophy    Wants to up dose on Flomax the first 2 weeks worked great.    HPI: Stephen Cross is a 80 y.o. male presenting on 08/27/2020 for Benign Prostatic Hypertrophy (Wants to up dose on Flomax the first 2 weeks worked great.)  BPH: Patient would like to discuss increasing his Flomax.  He states when he first started taking the Flomax 0.4 mg it was working well, but after a couple of weeks he felt like it was not working as well anymore.  He has gone on and off it multiple times over the past few months so that he can get a few weeks of help at a time.  He states without it he is up urinating at night every hour.  New complaints: None  Social history:  Relevant past medical, surgical, family and social history reviewed and updated as indicated. Interim medical history since our last visit reviewed.  Allergies and medications reviewed and  updated.  DATA REVIEWED: CHART IN EPIC  ROS: Negative unless specifically indicated above in HPI.    Current Outpatient Medications:  .  aspirin 81 MG EC tablet, Take 1 tablet by mouth daily., Disp: , Rfl:  .  meclizine (ANTIVERT) 25 MG tablet, Take 1 tablet (25 mg total) by mouth 3 (three) times daily as needed for dizziness., Disp: 30 tablet, Rfl: 2 .  pravastatin (PRAVACHOL) 80 MG tablet, Take 1 tablet (80 mg total) by mouth daily., Disp: 90 tablet, Rfl: 1 .  tamsulosin (FLOMAX) 0.4 MG CAPS capsule, Take 2 capsules (0.8 mg total) by mouth daily with supper., Disp: 60 capsule, Rfl: 2   Allergies  Allergen Reactions  . Codeine Nausea Only and Rash  . Lisinopril Cough and Other (See Comments)   Past Medical History:  Diagnosis Date  . 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 Syncope, cardiogenic 01/10/2012    Past Surgical History:  Procedure Laterality Date  . HERNIA REPAIR     x 2    Social History   Socioeconomic History  . Marital status: Married    Spouse name: patricia  . Number of children: 4  . Years of education: Not on file  . Highest education level: Not on file  Occupational History  . Occupation: retired   Tobacco Use  . Smoking  status: Former Research scientist (life sciences)  . Smokeless tobacco: Never Used  Vaping Use  . Vaping Use: Never used  Substance and Sexual Activity  . Alcohol use: Never  . Drug use: Never  . Sexual activity: Not on file  Other Topics Concern  . Not on file  Social History Narrative  . Not on file   Social Determinants of Health   Financial Resource Strain: Not on file  Food Insecurity: Not on file  Transportation Needs: Not on file  Physical Activity: Not on file  Stress: Not on file  Social Connections: Not on file  Intimate Partner Violence: Not on file        Objective:    BP (!) 143/79   Pulse (!) 104   Temp (!) 97.2 F (36.2 C) (Temporal)   Ht 6' (1.829 m)   Wt  146 lb 12.8 oz (66.6 kg)   SpO2 98%   BMI 19.91 kg/m   Wt Readings from Last 3 Encounters:  08/27/20 146 lb 12.8 oz (66.6 kg)  02/25/20 139 lb 9.6 oz (63.3 kg)  08/27/19 148 lb (67.1 kg)    Physical Exam Vitals reviewed.  Constitutional:      General: He is not in acute distress.    Appearance: Normal appearance. He is underweight. He is not ill-appearing, toxic-appearing or diaphoretic.  HENT:     Head: Normocephalic and atraumatic.  Eyes:     General: No scleral icterus.       Right eye: No discharge.        Left eye: No discharge.     Conjunctiva/sclera: Conjunctivae normal.  Cardiovascular:     Rate and Rhythm: Normal rate. Rhythm regularly irregular.     Heart sounds: Normal heart sounds. No murmur heard. No friction rub. No gallop.   Pulmonary:     Effort: Pulmonary effort is normal. No respiratory distress.     Breath sounds: Normal breath sounds. No stridor. No wheezing, rhonchi or rales.  Musculoskeletal:        General: Normal range of motion.     Cervical back: Normal range of motion.  Skin:    General: Skin is warm and dry.  Neurological:     Mental Status: He is alert and oriented to person, place, and time. Mental status is at baseline.  Psychiatric:        Mood and Affect: Mood normal.        Behavior: Behavior normal.        Thought Content: Thought content normal.        Judgment: Judgment normal.     Lab Results  Component Value Date   TSH 2.360 08/27/2019   Lab Results  Component Value Date   WBC 7.2 02/25/2020   HGB 16.0 02/25/2020   HCT 48.9 02/25/2020   MCV 86 02/25/2020   PLT 193 02/25/2020   Lab Results  Component Value Date   NA 145 (H) 08/27/2020   K 4.9 08/27/2020   CO2 22 08/27/2020   GLUCOSE 104 (H) 08/27/2020   BUN 15 08/27/2020   CREATININE 1.19 08/27/2020   BILITOT 0.4 08/27/2020   ALKPHOS 84 08/27/2020   AST 14 08/27/2020   ALT 7 08/27/2020   PROT 6.8 08/27/2020   ALBUMIN 4.2 08/27/2020   CALCIUM 9.4 08/27/2020    Lab Results  Component Value Date   CHOL 181 08/27/2020   Lab Results  Component Value Date   HDL 41 08/27/2020   Lab Results  Component Value Date  [place] 117 (H) 08/27/2020   Lab Results  Component Value Date   TRIG 127 08/27/2020   Lab Results  Component Value Date   CHOLHDL 4.4 08/27/2020   No results found for: HGBA1C

## 2020-10-09 ENCOUNTER — Ambulatory Visit (INDEPENDENT_AMBULATORY_CARE_PROVIDER_SITE_OTHER): Payer: Medicare Other | Admitting: Family Medicine

## 2020-10-09 ENCOUNTER — Other Ambulatory Visit: Payer: Self-pay

## 2020-10-09 ENCOUNTER — Encounter: Payer: Self-pay | Admitting: Family Medicine

## 2020-10-09 VITALS — BP 141/84 | HR 76 | Temp 96.9°F | Ht 72.0 in | Wt 145.6 lb

## 2020-10-09 DIAGNOSIS — R351 Nocturia: Secondary | ICD-10-CM | POA: Diagnosis not present

## 2020-10-09 DIAGNOSIS — N401 Enlarged prostate with lower urinary tract symptoms: Secondary | ICD-10-CM

## 2020-10-09 DIAGNOSIS — R944 Abnormal results of kidney function studies: Secondary | ICD-10-CM | POA: Diagnosis not present

## 2020-10-09 LAB — BMP8+EGFR
BUN/Creatinine Ratio: 15 (ref 10–24)
BUN: 16 mg/dL (ref 8–27)
CO2: 22 mmol/L (ref 20–29)
Calcium: 9.1 mg/dL (ref 8.6–10.2)
Chloride: 103 mmol/L (ref 96–106)
Creatinine, Ser: 1.09 mg/dL (ref 0.76–1.27)
Glucose: 101 mg/dL — ABNORMAL HIGH (ref 65–99)
Potassium: 4.5 mmol/L (ref 3.5–5.2)
Sodium: 143 mmol/L (ref 134–144)
eGFR: 68 mL/min/{1.73_m2} (ref >59)

## 2020-10-09 NOTE — Progress Notes (Signed)
Assessment & Plan:  1. Benign prostatic hyperplasia with nocturia Well controlled on current regimen.  - BMP8+EGFR  2. Decreased GFR Labs to assess. - BMP8+EGFR   Return in about 3 months (around 01/09/2021) for follow-up of chronic medication conditions.  Hendricks Limes, MSN, APRN, FNP-C [facility]  Subjective:    Patient ID: Stephen Cross, male    DOB: Dec 11, 1940, 80 y.o.   MRN: [id number]  Patient Care Team: Loman Brooklyn, FNP as PCP - General (Family Medicine)   Chief Complaint:  Chief Complaint  Patient presents with  . Prostate Check    6 week follow up     HPI: Stephen Cross is a 80 y.o. male presenting on 10/09/2020 for Prostate Check (6 week follow up )  Patient reports he is doing well on the increased dose of Flomax.  He states he is now sleeping through the night.  Every once in a while he will have a bad night but that is not the norm.  Patient's GFR had decreased when we did his lab work 6 weeks ago.  New complaints: None  Social history:  Relevant past medical, surgical, family and social history reviewed and updated as indicated. Interim medical history since our last visit reviewed.  Allergies and medications reviewed and updated.  DATA REVIEWED: CHART IN EPIC  ROS: Negative unless specifically indicated above in HPI.    Current Outpatient Medications:  .  aspirin 81 MG EC tablet, Take 1 tablet by mouth daily., Disp: , Rfl:  .  meclizine (ANTIVERT) 25 MG tablet, Take 1 tablet (25 mg total) by mouth 3 (three) times daily as needed for dizziness., Disp: 30 tablet, Rfl: 2 .  pravastatin (PRAVACHOL) 80 MG tablet, Take 1 tablet (80 mg total) by mouth daily., Disp: 90 tablet, Rfl: 1 .  tamsulosin (FLOMAX) 0.4 MG CAPS capsule, Take 2 capsules (0.8 mg total) by mouth daily with supper., Disp: 60 capsule, Rfl: 2   Allergies  Allergen Reactions  . Codeine Nausea Only and Rash  . Lisinopril Cough and Other (See Comments)   Past  Medical History:  Diagnosis Date  . 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 Syncope, cardiogenic 01/10/2012    Past Surgical History:  Procedure Laterality Date  . HERNIA REPAIR     x 2    Social History   Socioeconomic History  . Marital status: Married    Spouse name: patricia  . Number of children: 4  . Years of education: Not on file  . Highest education level: Not on file  Occupational History  . Occupation: retired   Tobacco Use  . Smoking status: Former Research scientist (life sciences)  . Smokeless tobacco: Never Used  Vaping Use  . Vaping Use: Never used  Substance and Sexual Activity  . Alcohol use: Never  . Drug use: Never  . Sexual activity: Not on file  Other Topics Concern  . Not on file  Social History Narrative  . Not on file   Social Determinants of Health   Financial Resource Strain: Not on file  Food Insecurity: Not on file  Transportation Needs: Not on file  Physical Activity: Not on file  Stress: Not on file  Social Connections: Not on file  Intimate Partner Violence: Not on file        Objective:    BP (!) 141/84   Pulse 76   Temp (!) 96.9 F (  36.1 C) (Temporal)   Ht 6' (1.829 m)   Wt 145 lb 9.6 oz (66 kg)   SpO2 99%   BMI 19.75 kg/m   Wt Readings from Last 3 Encounters:  10/09/20 145 lb 9.6 oz (66 kg)  08/27/20 146 lb 12.8 oz (66.6 kg)  02/25/20 139 lb 9.6 oz (63.3 kg)    Physical Exam Vitals reviewed.  Constitutional:      General: He is not in acute distress.    Appearance: Normal appearance. He is not ill-appearing, toxic-appearing or diaphoretic.  HENT:     Head: Normocephalic and atraumatic.  Eyes:     General: No scleral icterus.       Right eye: No discharge.        Left eye: No discharge.     Conjunctiva/sclera: Conjunctivae normal.  Cardiovascular:     Rate and Rhythm: Normal rate.  Pulmonary:     Effort: Pulmonary effort is normal. No respiratory distress.   Musculoskeletal:        General: Normal range of motion.     Cervical back: Normal range of motion.  Skin:    General: Skin is warm and dry.  Neurological:     Mental Status: He is alert and oriented to person, place, and time. Mental status is at baseline.  Psychiatric:        Mood and Affect: Mood normal.        Behavior: Behavior normal.        Thought Content: Thought content normal.        Judgment: Judgment normal.     Lab Results  Component Value Date   TSH 2.360 08/27/2019   Lab Results  Component Value Date   WBC 7.2 02/25/2020   HGB 16.0 02/25/2020   HCT 48.9 02/25/2020   MCV 86 02/25/2020   PLT 193 02/25/2020   Lab Results  Component Value Date   NA 145 (H) 08/27/2020   K 4.9 08/27/2020   CO2 22 08/27/2020   GLUCOSE 104 (H) 08/27/2020   BUN 15 08/27/2020   CREATININE 1.19 08/27/2020   BILITOT 0.4 08/27/2020   ALKPHOS 84 08/27/2020   AST 14 08/27/2020   ALT 7 08/27/2020   PROT 6.8 08/27/2020   ALBUMIN 4.2 08/27/2020   CALCIUM 9.4 08/27/2020   Lab Results  Component Value Date   CHOL 181 08/27/2020   Lab Results  Component Value Date   HDL 41 08/27/2020   Lab Results  Component Value Date   LDLCALC 117 (H) 08/27/2020   Lab Results  Component Value Date   TRIG 127 08/27/2020   Lab Results  Component Value Date   CHOLHDL 4.4 08/27/2020   No results found for: HGBA1C

## 2020-11-19 ENCOUNTER — Encounter: Payer: Self-pay | Admitting: *Deleted

## 2020-12-26 ENCOUNTER — Other Ambulatory Visit: Payer: Self-pay | Admitting: Family Medicine

## 2020-12-26 DIAGNOSIS — N401 Enlarged prostate with lower urinary tract symptoms: Secondary | ICD-10-CM

## 2020-12-26 DIAGNOSIS — R351 Nocturia: Secondary | ICD-10-CM

## 2021-01-09 ENCOUNTER — Ambulatory Visit: Payer: Medicare Other | Admitting: Family Medicine

## 2021-01-15 ENCOUNTER — Encounter: Payer: Self-pay | Admitting: Family Medicine

## 2021-01-15 ENCOUNTER — Ambulatory Visit (INDEPENDENT_AMBULATORY_CARE_PROVIDER_SITE_OTHER): Payer: Medicare Other | Admitting: Family Medicine

## 2021-01-15 ENCOUNTER — Other Ambulatory Visit: Payer: Self-pay

## 2021-01-15 VITALS — BP 128/72 | HR 57 | Temp 96.9°F | Ht 73.0 in | Wt 141.6 lb

## 2021-01-15 DIAGNOSIS — I10 Essential (primary) hypertension: Secondary | ICD-10-CM | POA: Diagnosis not present

## 2021-01-15 DIAGNOSIS — E782 Mixed hyperlipidemia: Secondary | ICD-10-CM

## 2021-01-15 DIAGNOSIS — R351 Nocturia: Secondary | ICD-10-CM

## 2021-01-15 DIAGNOSIS — I2510 Atherosclerotic heart disease of native coronary artery without angina pectoris: Secondary | ICD-10-CM

## 2021-01-15 DIAGNOSIS — R42 Dizziness and giddiness: Secondary | ICD-10-CM

## 2021-01-15 DIAGNOSIS — N401 Enlarged prostate with lower urinary tract symptoms: Secondary | ICD-10-CM | POA: Diagnosis not present

## 2021-01-15 DIAGNOSIS — R310 Gross hematuria: Secondary | ICD-10-CM

## 2021-01-15 MED ORDER — TAMSULOSIN HCL 0.4 MG PO CAPS: ORAL_CAPSULE | ORAL | 1 refills | Status: DC

## 2021-01-15 MED ORDER — MECLIZINE HCL 25 MG PO TABS: 25.0000 mg | ORAL_TABLET | Freq: Three times a day (TID) | ORAL | 2 refills | Status: DC | PRN

## 2021-01-15 MED ORDER — PRAVASTATIN SODIUM 80 MG PO TABS: 80.0000 mg | ORAL_TABLET | Freq: Every day | ORAL | 1 refills | Status: DC
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
## 2021-01-16 LAB — CBC WITH DIFFERENTIAL/PLATELET
Basophils Absolute: 0.1 10*3/uL (ref 0.0–0.2)
Basos: 1 %
EOS (ABSOLUTE): 0.4 10*3/uL (ref 0.0–0.4)
Eos: 7 %
Hematocrit: 46.2 % (ref 37.5–51.0)
Hemoglobin: 15.1 g/dL (ref 13.0–17.7)
Immature Grans (Abs): 0 10*3/uL (ref 0.0–0.1)
Immature Granulocytes: 1 %
Lymphocytes Absolute: 1.6 10*3/uL (ref 0.7–3.1)
Lymphs: 29 %
MCH: 28.4 pg (ref 26.6–33.0)
MCHC: 32.7 g/dL (ref 31.5–35.7)
MCV: 87 fL (ref 79–97)
Monocytes Absolute: 0.5 10*3/uL (ref 0.1–0.9)
Monocytes: 9 %
Neutrophils Absolute: 2.9 10*3/uL (ref 1.4–7.0)
Neutrophils: 53 %
Platelets: 176 10*3/uL (ref 150–450)
RBC: 5.31 x10E6/uL (ref 4.14–5.80)
RDW: 12.9 % (ref 11.6–15.4)
WBC: 5.4 10*3/uL (ref 3.4–10.8)

## 2021-01-16 LAB — CMP14+EGFR
ALT: 6 IU/L (ref 0–44)
AST: 12 IU/L (ref 0–40)
Albumin/Globulin Ratio: 1.8 (ref 1.2–2.2)
Albumin: 4.1 g/dL (ref 3.6–4.6)
Alkaline Phosphatase: 78 IU/L (ref 44–121)
BUN/Creatinine Ratio: 14 (ref 10–24)
BUN: 14 mg/dL (ref 8–27)
Bilirubin Total: 0.5 mg/dL (ref 0.0–1.2)
CO2: 21 mmol/L (ref 20–29)
Calcium: 8.8 mg/dL (ref 8.6–10.2)
Chloride: 107 mmol/L — ABNORMAL HIGH (ref 96–106)
Creatinine, Ser: 0.99 mg/dL (ref 0.76–1.27)
Globulin, Total: 2.3 g/dL (ref 1.5–4.5)
Glucose: 98 mg/dL (ref 65–99)
Potassium: 4.2 mmol/L (ref 3.5–5.2)
Sodium: 144 mmol/L (ref 134–144)
Total Protein: 6.4 g/dL (ref 6.0–8.5)
eGFR: 77 mL/min/{1.73_m2} (ref >59)

## 2021-01-16 LAB — LIPID PANEL
Chol/HDL Ratio: 4.2 ratio (ref 0.0–5.0)
Cholesterol, Total: 166 mg/dL (ref 100–199)
HDL: 40 mg/dL (ref >39)
LDL Chol Calc (NIH): 107 mg/dL — ABNORMAL HIGH (ref 0–99)
Triglycerides: 105 mg/dL (ref 0–149)
VLDL Cholesterol Cal: 19 mg/dL (ref 5–40)

## 2021-01-16 LAB — PSA, TOTAL AND FREE
PSA, Free Pct: 31.1 %
PSA, Free: 1.18 ng/mL
Prostate Specific Ag, Serum: 3.8 ng/mL (ref 0.0–4.0)

## 2021-01-22 ENCOUNTER — Ambulatory Visit: Payer: Self-pay | Admitting: Urology

## 2021-01-22 ENCOUNTER — Ambulatory Visit: Payer: Medicare Other

## 2021-04-28 ENCOUNTER — Other Ambulatory Visit: Payer: Self-pay | Admitting: Family Medicine

## 2021-04-28 DIAGNOSIS — R42 Dizziness and giddiness: Secondary | ICD-10-CM

## 2021-04-29 NOTE — Telephone Encounter (Signed)
Pt was given it only as he needed it. Has not had to use that often. Only has 3 left and had an episode yesterday, getting better, but is needing to take a few more.

## 2021-04-29 NOTE — Telephone Encounter (Signed)
Aware refill sent to pharmacy ?

## 2021-06-22 ENCOUNTER — Ambulatory Visit: Payer: Medicare Other

## 2021-06-23 ENCOUNTER — Ambulatory Visit: Payer: Medicare Other

## 2021-06-29 ENCOUNTER — Ambulatory Visit (INDEPENDENT_AMBULATORY_CARE_PROVIDER_SITE_OTHER): Payer: Medicare Other

## 2021-06-29 VITALS — Ht 72.0 in | Wt 141.0 lb

## 2021-06-29 DIAGNOSIS — Z0000 Encounter for general adult medical examination without abnormal findings: Secondary | ICD-10-CM | POA: Diagnosis not present

## 2021-06-29 NOTE — Patient Instructions (Signed)
Mr. Winner , Thank you for taking time to come for your Medicare Wellness Visit. I appreciate your ongoing commitment to your health goals. Please review the following plan we discussed and let me know if I can assist you in the future.   Screening recommendations/referrals: Colonoscopy: No longer required due to age.   Recommended yearly ophthalmology/optometry visit for glaucoma screening and checkup Recommended yearly dental visit for hygiene and checkup  Vaccinations: Influenza vaccine: Due. Repeat annually  Pneumococcal vaccine: Done 06/09/2015 and 06/13/2017 Tdap vaccine: Done 06/09/2015 Repeat in 10 years  Shingles vaccine: Shingrix discussed. Please contact your pharmacy for coverage information.     Covid-19: Done 08/14/2019, 09/14/2019 and 04/23/2020.  Advanced directives: Advance directive discussed with you today. Even though you declined this today, please call our office should you change your mind, and we can give you the proper paperwork for you to fill out.   Conditions/risks identified: Aim for 30 minutes of exercise or brisk walking each day, drink 6-8 glasses of water and eat lots of fruits and vegetables. KEEP UP THE GOOD WORK!!  Next appointment: Follow up in one year for your annual wellness visit. 2023.  Preventive Care 57 Years and Older, Male  Preventive care refers to lifestyle choices and visits with your health care provider that can promote health and wellness. What does preventive care include? A yearly physical exam. This is also called an annual well check. Dental exams once or twice a year. Routine eye exams. Ask your health care provider how often you should have your eyes checked. Personal lifestyle choices, including: Daily care of your teeth and gums. Regular physical activity. Eating a healthy diet. Avoiding tobacco and drug use. Limiting alcohol use. Practicing safe sex. Taking low doses of aspirin every day. Taking vitamin and mineral  supplements as recommended by your health care provider. What happens during an annual well check? The services and screenings done by your health care provider during your annual well check will depend on your age, overall health, lifestyle risk factors, and family history of disease. Counseling  Your health care provider may ask you questions about your: Alcohol use. Tobacco use. Drug use. Emotional well-being. Home and relationship well-being. Sexual activity. Eating habits. History of falls. Memory and ability to understand (cognition). Work and work Astronomer. Screening  You may have the following tests or measurements: Height, weight, and BMI. Blood pressure. Lipid and cholesterol levels. These may be checked every 5 years, or more frequently if you are over 64 years old. Skin check. Lung cancer screening. You may have this screening every year starting at age 65 if you have a 30-pack-year history of smoking and currently smoke or have quit within the past 15 years. Fecal occult blood test (FOBT) of the stool. You may have this test every year starting at age 33. Flexible sigmoidoscopy or colonoscopy. You may have a sigmoidoscopy every 5 years or a colonoscopy every 10 years starting at age 66. Prostate cancer screening. Recommendations will vary depending on your family history and other risks. Hepatitis C blood test. Hepatitis B blood test. Sexually transmitted disease (STD) testing. Diabetes screening. This is done by checking your blood sugar (glucose) after you have not eaten for a while (fasting). You may have this done every 1-3 years. Abdominal aortic aneurysm (AAA) screening. You may need this if you are a current or former smoker. Osteoporosis. You may be screened starting at age 75 if you are at high risk. Talk with your health care  provider about your test results, treatment options, and if necessary, the need for more tests. Vaccines  Your health care provider  may recommend certain vaccines, such as: Influenza vaccine. This is recommended every year. Tetanus, diphtheria, and acellular pertussis (Tdap, Td) vaccine. You may need a Td booster every 10 years. Zoster vaccine. You may need this after age 98. Pneumococcal 13-valent conjugate (PCV13) vaccine. One dose is recommended after age 29. Pneumococcal polysaccharide (PPSV23) vaccine. One dose is recommended after age 73. Talk to your health care provider about which screenings and vaccines you need and how often you need them. This information is not intended to replace advice given to you by your health care provider. Make sure you discuss any questions you have with your health care provider. Document Released: 08/15/2015 Document Revised: 04/07/2016 Document Reviewed: 05/20/2015 Elsevier Interactive Patient Education  2017 [employer].  Fall Prevention in the Home Falls can cause injuries. They can happen to people of all ages. There are many things you can do to make your home safe and to help prevent falls. What can I do on the outside of my home? Regularly fix the edges of walkways and driveways and fix any cracks. Remove anything that might make you trip as you walk through a door, such as a raised step or threshold. Trim any bushes or trees on the path to your home. Use bright outdoor lighting. Clear any walking paths of anything that might make someone trip, such as rocks or tools. Regularly check to see if handrails are loose or broken. Make sure that both sides of any steps have handrails. Any raised decks and porches should have guardrails on the edges. Have any leaves, snow, or ice cleared regularly. Use sand or salt on walking paths during winter. Clean up any spills in your garage right away. This includes oil or grease spills. What can I do in the bathroom? Use night lights. Install grab bars by the toilet and in the tub and shower. Do not use towel bars as grab bars. Use  non-skid mats or decals in the tub or shower. If you need to sit down in the shower, use a plastic, non-slip stool. Keep the floor dry. Clean up any water that spills on the floor as soon as it happens. Remove soap buildup in the tub or shower regularly. Attach bath mats securely with double-sided non-slip rug tape. Do not have throw rugs and other things on the floor that can make you trip. What can I do in the bedroom? Use night lights. Make sure that you have a light by your bed that is easy to reach. Do not use any sheets or blankets that are too big for your bed. They should not hang down onto the floor. Have a firm chair that has side arms. You can use this for support while you get dressed. Do not have throw rugs and other things on the floor that can make you trip. What can I do in the kitchen? Clean up any spills right away. Avoid walking on wet floors. Keep items that you use a lot in easy-to-reach places. If you need to reach something above you, use a strong step stool that has a grab bar. Keep electrical cords out of the way. Do not use floor polish or wax that makes floors slippery. If you must use wax, use non-skid floor wax. Do not have throw rugs and other things on the floor that can make you trip. What can I  do with my stairs? Do not leave any items on the stairs. Make sure that there are handrails on both sides of the stairs and use them. Fix handrails that are broken or loose. Make sure that handrails are as long as the stairways. Check any carpeting to make sure that it is firmly attached to the stairs. Fix any carpet that is loose or worn. Avoid having throw rugs at the top or bottom of the stairs. If you do have throw rugs, attach them to the floor with carpet tape. Make sure that you have a light switch at the top of the stairs and the bottom of the stairs. If you do not have them, ask someone to add them for you. What else can I do to help prevent falls? Wear  shoes that: Do not have high heels. Have rubber bottoms. Are comfortable and fit you well. Are closed at the toe. Do not wear sandals. If you use a stepladder: Make sure that it is fully opened. Do not climb a closed stepladder. Make sure that both sides of the stepladder are locked into place. Ask someone to hold it for you, if possible. Clearly mark and make sure that you can see: Any grab bars or handrails. First and last steps. Where the edge of each step is. Use tools that help you move around (mobility aids) if they are needed. These include: Canes. Walkers. Scooters. Crutches. Turn on the lights when you go into a dark area. Replace any light bulbs as soon as they burn out. Set up your furniture so you have a clear path. Avoid moving your furniture around. If any of your floors are uneven, fix them. If there are any pets around you, be aware of where they are. Review your medicines with your doctor. Some medicines can make you feel dizzy. This can increase your chance of falling. Ask your doctor what other things that you can do to help prevent falls. This information is not intended to replace advice given to you by your health care provider. Make sure you discuss any questions you have with your health care provider. Document Released: 05/15/2009 Document Revised: 12/25/2015 Document Reviewed: 08/23/2014 Elsevier Interactive Patient Education  2017 [employer].

## 2021-06-29 NOTE — Progress Notes (Signed)
Subjective:   Stephen Cross is a 80 y.o. male who presents for an Initial Medicare Annual Wellness Visit. Virtual Visit via Telephone Note  I connected with  Stephen Cross on 06/29/21 at  3:30 PM EST by telephone and verified that I am speaking with the correct person using two identifiers.  Location: Patient: Home Provider: [facility] Persons participating in the virtual visit: patient/Nurse Health Advisor   I discussed the limitations, risks, security and privacy concerns of performing an evaluation and management service by telephone and the availability of in person appointments. The patient expressed understanding and agreed to proceed.  Interactive audio and video telecommunications were attempted between this nurse and patient, however failed, due to patient having technical difficulties OR patient did not have access to video capability.  We continued and completed visit with audio only.  Some vital signs may be absent or patient reported.   Darral Dash, LPN  Review of Systems     Cardiac Risk Factors include: advanced age (>28men, >29 women);hypertension;dyslipidemia;male gender     Objective:    Today's Vitals   06/29/21 1525  Weight: 141 lb (64 kg)  Height: 6' (1.829 m)   Body mass index is 19.12 kg/m.  Advanced Directives 06/29/2021  Does Patient Have a Medical Advance Directive? No  Would patient like information on creating a medical advance directive? No - Patient declined    Current Medications (verified) Outpatient Encounter Medications as of 06/29/2021  Medication Sig   aspirin 81 MG EC tablet Take 1 tablet by mouth daily.   ibuprofen (ADVIL) 800 MG tablet Take by mouth.   meclizine (ANTIVERT) 25 MG tablet Take 1 tablet (25 mg total) by mouth 3 (three) times daily as needed for dizziness.   Meclizine HCl 25 MG CHEW TAKE 1 TABLET 3 TIMES DAILY AS NEEDED FOR DIZZINESS   pravastatin (PRAVACHOL) 80 MG tablet Take 1 tablet (80 mg total) by mouth daily.    tamsulosin (FLOMAX) 0.4 MG CAPS capsule TAKE 2 CAPSULES DAILY WITH SUPPER   No facility-administered encounter medications on file as of 06/29/2021.    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   Overview:  PEA arrest on induction   Syncope, cardiogenic 01/10/2012   Past Surgical History:  Procedure Laterality Date   HERNIA REPAIR     x 2   Family History  Problem Relation Age of Onset   Heart disease Mother    Obesity Sister    Heart disease Sister    Social History   Socioeconomic History   Marital status: Married    Spouse name: patricia   Number of children: 4   Years of education: Not on file   Highest education level: Not on file  Occupational History   Occupation: retired   Tobacco Use   Smoking status: Former   Smokeless tobacco: Never  Building services engineer Use: Never used  Substance and Sexual Activity   Alcohol use: Never   Drug use: Never   Sexual activity: Not on file  Other Topics Concern   Not on file  Social History Narrative   3 daughters and 1 son   Social Determinants of Health   Financial Resource Strain: Low Risk    Difficulty of Paying Living Expenses: Not hard at all  Food Insecurity: No Food Insecurity   Worried About Programme researcher, broadcasting/film/video  in the Last Year: Never true   Ran Out of Food in the Last Year: Never true  Transportation Needs: No Transportation Needs   Lack of Transportation (Medical): No   Lack of Transportation (Non-Medical): No  Physical Activity: Sufficiently Active   Days of Exercise per Week: 5 days   Minutes of Exercise per Session: 30 min  Stress: No Stress Concern Present   Feeling of Stress : Not at all  Social Connections: Socially Integrated   Frequency of Communication with Friends and Family: More than three times a week   Frequency of Social Gatherings with Friends and Family: More than  three times a week   Attends Religious Services: More than 4 times per year   Active Member of [employer] or Organizations: Yes   Attends Engineer, structural: More than 4 times per year   Marital Status: Married    Tobacco Counseling Counseling given: Not Answered   Clinical Intake:  Pre-visit preparation completed: Yes  Pain : No/denies pain     BMI - recorded: 19.12 Nutritional Status: BMI of 19-24  Normal Nutritional Risks: None Diabetes: No  How often do you need to have someone help you when you read instructions, pamphlets, or other written materials from your doctor or pharmacy?: 1 - Never  Diabetic?no  Interpreter Needed?: No      Activities of Daily Living In your present state of health, do you have any difficulty performing the following activities: 06/29/2021  Hearing? Y  Vision? N  Difficulty concentrating or making decisions? Y  Comment Memory at times.  Walking or climbing stairs? N  Dressing or bathing? N  Doing errands, shopping? N  Preparing Food and eating ? N  Using the Toilet? N  In the past six months, have you accidently leaked urine? N  Do you have problems with loss of bowel control? N  Managing your Medications? N  Managing your Finances? N  Housekeeping or managing your Housekeeping? N  Some recent data might be hidden    Patient Care Team: Gwenlyn Fudge, FNP as PCP - General (Family Medicine)  Indicate any recent Medical Services you may have received from other than [facility] providers in the past year (date may be approximate).     Assessment:   This is a routine wellness examination for Stephen Cross.  Hearing/Vision screen Hearing Screening - Comments:: Some hearing issues.  Vision Screening - Comments:: Glasses. [facility] [place] 01/2020  Dietary issues and exercise activities discussed: Current Exercise Habits: Home exercise routine, Type of exercise: walking, Time (Minutes): 30, Frequency (Times/Week): 5, Weekly Exercise  (Minutes/Week): 150, Intensity: Mild, Exercise limited by: cardiac condition(s)   Goals Addressed             This Visit's Progress    Have 3 meals a day       Maintain weight and eat a healthy diet.        Depression Screen PHQ 2/9 Scores 06/29/2021 01/15/2021 10/09/2020 08/27/2020 02/25/2020 08/27/2019  PHQ - 2 Score 0 0 0 0 0 0  PHQ- 9 Score - 0 - - - -    Fall Risk Fall Risk  06/29/2021 01/15/2021 10/09/2020 08/27/2020 02/25/2020  Falls in the past year? 1 0 0 0 0  Number falls in past yr: 0 - - - -  Injury with Fall? 1 - - - -  Risk for fall due to : Impaired balance/gait;Impaired mobility;History of fall(s) - - - -  Follow up Falls prevention discussed - - - -  FALL RISK PREVENTION PERTAINING TO THE HOME:  Any stairs in or around the home? Yes  If so, are there any without handrails? No  Home free of loose throw rugs in walkways, pet beds, electrical cords, etc? Yes  Adequate lighting in your home to reduce risk of falls? Yes   ASSISTIVE DEVICES UTILIZED TO PREVENT FALLS:  Life alert? No  Use of a cane, walker or w/c? Yes  Grab bars in the bathroom? Yes  Shower chair or bench in shower? Yes  Elevated toilet seat or a handicapped toilet? Yes   TIMED UP AND GO:  Was the test performed? No . Phone visit.   Cognitive Function:     6CIT Screen 06/29/2021  What Year? 0 points  What month? 0 points  What time? 0 points  Count back from 20 0 points  Months in reverse 2 points  Repeat phrase 2 points  Total Score 4    Immunizations Immunization History  Administered Date(s) Administered   Fluad Quad(high Dose 65+) 04/23/2020   Influenza, High Dose Seasonal PF 05/05/2016, 05/03/2017, 05/30/2018   Influenza-Unspecified 05/05/2016, 05/03/2017, 05/30/2018   Moderna Sars-Covid-2 Vaccination 08/14/2019, 09/14/2019, 04/23/2020   Pneumococcal Conjugate-13 06/13/2017   Pneumococcal Polysaccharide-23 06/09/2015   Tdap 06/09/2015    TDAP status: Up to date  Flu  Vaccine status: Due, Education has been provided regarding the importance of this vaccine. Advised may receive this vaccine at local pharmacy or Health Dept. Aware to provide a copy of the vaccination record if obtained from local pharmacy or Health Dept. Verbalized acceptance and understanding.  Pneumococcal vaccine status: Up to date  Covid-19 vaccine status: Information provided on how to obtain vaccines.   Qualifies for Shingles Vaccine? Yes   Zostavax completed No   Shingrix Completed?: No.    Education has been provided regarding the importance of this vaccine. Patient has been advised to call insurance company to determine out of pocket expense if they have not yet received this vaccine. Advised may also receive vaccine at local pharmacy or Health Dept. Verbalized acceptance and understanding.  Screening Tests Health Maintenance  Topic Date Due   Zoster Vaccines- Shingrix (1 of 2) Never done   COVID-19 Vaccine (4 - Booster for Moderna series) 06/18/2020   INFLUENZA VACCINE  03/02/2021   TETANUS/TDAP  06/08/2025   Pneumonia Vaccine 32+ Years old  Completed   HPV VACCINES  Aged Out    Health Maintenance  Health Maintenance Due  Topic Date Due   Zoster Vaccines- Shingrix (1 of 2) Never done   COVID-19 Vaccine (4 - Booster for Moderna series) 06/18/2020   INFLUENZA VACCINE  03/02/2021    Colorectal cancer screening: No longer required.   Lung Cancer Screening: (Low Dose CT Chest recommended if Age 62-80 years, 30 pack-year currently smoking OR have quit w/in 15years.) does not qualify. Quit smoking 40 years ago.  Additional Screening:  Hepatitis C Screening: does not qualify  Vision Screening: Recommended annual ophthalmology exams for early detection of glaucoma and other disorders of the eye. Is the patient up to date with their annual eye exam?  No  Who is the provider or what is the name of the office in which the patient attends annual eye exams? [facility] [place]  01/2020 If pt is not established with a provider, would they like to be referred to a provider to establish care? No .   Dental Screening: Recommended annual dental exams for proper oral hygiene  Community Resource Referral / Chronic Care  Management: CRR required this visit?  No   CCM required this visit?  No      Plan:     I have personally reviewed and noted the following in the patient's chart:   Medical and social history Use of alcohol, tobacco or illicit drugs  Current medications and supplements including opioid prescriptions. Patient is not currently taking opioid prescriptions. Functional ability and status Nutritional status Physical activity Advanced directives List of other physicians Hospitalizations, surgeries, and ER visits in previous 12 months Vitals Screenings to include cognitive, depression, and falls Referrals and appointments  In addition, I have reviewed and discussed with patient certain preventive protocols, quality metrics, and best practice recommendations. A written personalized care plan for preventive services as well as general preventive health recommendations were provided to patient.     Darral Dash, LPN   86/76/1951   Nurse Notes: Phone visit. Pt currently with decreased mobility due to breaking leg after falling out of a tree. Up to date on age related health maintenance and vaccines. Pt states he has appointment scheduled for flu vaccine. Discussed shingles and how to obtain.

## 2021-07-02 ENCOUNTER — Other Ambulatory Visit: Payer: Self-pay | Admitting: Nurse Practitioner

## 2021-07-02 ENCOUNTER — Telehealth: Payer: Self-pay | Admitting: Family Medicine

## 2021-07-02 ENCOUNTER — Encounter: Payer: Self-pay | Admitting: Family Medicine

## 2021-07-02 ENCOUNTER — Ambulatory Visit (INDEPENDENT_AMBULATORY_CARE_PROVIDER_SITE_OTHER): Payer: Medicare Other | Admitting: Family Medicine

## 2021-07-02 DIAGNOSIS — U071 COVID-19: Secondary | ICD-10-CM

## 2021-07-02 MED ORDER — MOLNUPIRAVIR EUA 200MG CAPSULE: 4.0000 | ORAL_CAPSULE | Freq: Two times a day (BID) | ORAL | 0 refills | Status: DC

## 2021-07-02 MED ORDER — MOLNUPIRAVIR EUA 200MG CAPSULE: 4.0000 | ORAL_CAPSULE | Freq: Two times a day (BID) | ORAL | 0 refills | Status: AC

## 2021-07-02 NOTE — Progress Notes (Signed)
   Virtual Visit via Telephone Note  I connected with Stephen Cross on 07/02/21 at 4:06 PM by telephone and verified that I am speaking with the correct person using two identifiers. Stephen Cross is currently located at home and his wife is currently with him during this visit. The provider, Gwenlyn Fudge, FNP is located in their office at time of visit.  I discussed the limitations, risks, security and privacy concerns of performing an evaluation and management service by telephone and the availability of in person appointments. I also discussed with the patient that there may be a patient responsible charge related to this service. The patient expressed understanding and agreed to proceed.  Subjective: PCP: Gwenlyn Fudge, FNP  Chief Complaint  Patient presents with   Covid Positive   Patient complains of runny nose and sore throat. Onset of symptoms was 1 week ago, gradually improving since that time. He is drinking plenty of fluids. Evaluation to date: at home COVID test positive. Treatment to date:  Robitussin & Ibuprofen .  He does not smoke.    ROS: Per HPI  Current Outpatient Medications:    aspirin 81 MG EC tablet, Take 1 tablet by mouth daily., Disp: , Rfl:    ibuprofen (ADVIL) 800 MG tablet, Take by mouth., Disp: , Rfl:    meclizine (ANTIVERT) 25 MG tablet, Take 1 tablet (25 mg total) by mouth 3 (three) times daily as needed for dizziness., Disp: 30 tablet, Rfl: 2   Meclizine HCl 25 MG CHEW, TAKE 1 TABLET 3 TIMES DAILY AS NEEDED FOR DIZZINESS, Disp: 30 tablet, Rfl: 0   molnupiravir EUA (LAGEVRIO) 200 mg CAPS capsule, Take 4 capsules (800 mg total) by mouth 2 (two) times daily for 5 days., Disp: 40 capsule, Rfl: 0   pravastatin (PRAVACHOL) 80 MG tablet, Take 1 tablet (80 mg total) by mouth daily., Disp: 90 tablet, Rfl: 1   tamsulosin (FLOMAX) 0.4 MG CAPS capsule, TAKE 2 CAPSULES DAILY WITH SUPPER, Disp: 180 capsule, Rfl: 1  Allergies  Allergen Reactions   Codeine Nausea  Only and Rash   Lisinopril Cough and Other (See Comments)   Past Medical History:  Diagnosis Date   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 COVID-19 Symptoms have almost completely resolved. Continue symptom management.    Follow Up Instructions:  I discussed the assessment and treatment plan with the patient. The patient was provided an opportunity to ask questions and all were answered. The patient agreed with the plan and demonstrated an understanding of the instructions.   The patient was advised to call back or seek an in-person evaluation if the symptoms worsen or if the condition fails to improve as anticipated.  The above assessment and management plan was discussed with the patient. The patient verbalized understanding of and has agreed to the management plan. Patient is aware to call the clinic if symptoms persist or worsen. Patient is aware when to return to the clinic for a follow-up visit. Patient educated on when it is appropriate to go to the emergency department.   Time call ended: 4:17 PM  I provided 11 minutes of non-face-to-face time during this encounter.  Deliah Boston, MSN, APRN, FNP-C [facility] 07/02/21

## 2021-07-02 NOTE — Telephone Encounter (Signed)
Sent to [facility]

## 2021-07-02 NOTE — Progress Notes (Signed)
Wife tested positive for covid. She tested him even though nt symptomatic and his was posoitive as well.  Meds ordered this encounter  Medications   molnupiravir EUA (LAGEVRIO) 200 mg CAPS capsule    Sig: Take 4 capsules (800 mg total) by mouth 2 (two) times daily for 5 days.    Dispense:  40 capsule    Refill:  0    Order Specific Question:   Supervising Provider    Answer:   Arville Care A [id number]

## 2021-07-16 ENCOUNTER — Other Ambulatory Visit: Payer: Self-pay

## 2021-07-16 ENCOUNTER — Encounter: Payer: Self-pay | Admitting: Family Medicine

## 2021-07-16 ENCOUNTER — Ambulatory Visit (INDEPENDENT_AMBULATORY_CARE_PROVIDER_SITE_OTHER): Payer: Medicare Other | Admitting: Family Medicine

## 2021-07-16 ENCOUNTER — Ambulatory Visit (INDEPENDENT_AMBULATORY_CARE_PROVIDER_SITE_OTHER): Payer: Medicare Other

## 2021-07-16 VITALS — BP 148/87 | HR 88 | Temp 97.2°F | Ht 72.0 in | Wt 143.2 lb

## 2021-07-16 DIAGNOSIS — M8588 Other specified disorders of bone density and structure, other site: Secondary | ICD-10-CM

## 2021-07-16 DIAGNOSIS — M85871 Other specified disorders of bone density and structure, right ankle and foot: Secondary | ICD-10-CM

## 2021-07-16 DIAGNOSIS — R42 Dizziness and giddiness: Secondary | ICD-10-CM

## 2021-07-16 DIAGNOSIS — M8580 Other specified disorders of bone density and structure, unspecified site: Secondary | ICD-10-CM

## 2021-07-16 DIAGNOSIS — N401 Enlarged prostate with lower urinary tract symptoms: Secondary | ICD-10-CM

## 2021-07-16 DIAGNOSIS — E782 Mixed hyperlipidemia: Secondary | ICD-10-CM | POA: Diagnosis not present

## 2021-07-16 DIAGNOSIS — Z23 Encounter for immunization: Secondary | ICD-10-CM | POA: Diagnosis not present

## 2021-07-16 DIAGNOSIS — I2510 Atherosclerotic heart disease of native coronary artery without angina pectoris: Secondary | ICD-10-CM | POA: Diagnosis not present

## 2021-07-16 DIAGNOSIS — R351 Nocturia: Secondary | ICD-10-CM

## 2021-07-16 LAB — CBC WITH DIFFERENTIAL/PLATELET
Basophils Absolute: 0.1 10*3/uL (ref 0.0–0.2)
Basos: 1 %
EOS (ABSOLUTE): 0.7 10*3/uL — ABNORMAL HIGH (ref 0.0–0.4)
Eos: 9 %
Hematocrit: 47.3 % (ref 37.5–51.0)
Hemoglobin: 15.7 g/dL (ref 13.0–17.7)
Immature Grans (Abs): 0.1 10*3/uL (ref 0.0–0.1)
Immature Granulocytes: 1 %
Lymphocytes Absolute: 1.4 10*3/uL (ref 0.7–3.1)
Lymphs: 18 %
MCH: 28.9 pg (ref 26.6–33.0)
MCHC: 33.2 g/dL (ref 31.5–35.7)
MCV: 87 fL (ref 79–97)
Monocytes Absolute: 0.6 10*3/uL (ref 0.1–0.9)
Monocytes: 9 %
Neutrophils Absolute: 4.6 10*3/uL (ref 1.4–7.0)
Neutrophils: 62 %
Platelets: 203 10*3/uL (ref 150–450)
RBC: 5.44 x10E6/uL (ref 4.14–5.80)
RDW: 13.1 % (ref 11.6–15.4)
WBC: 7.5 10*3/uL (ref 3.4–10.8)

## 2021-07-16 LAB — CMP14+EGFR
ALT: 6 IU/L (ref 0–44)
AST: 13 IU/L (ref 0–40)
Albumin/Globulin Ratio: 2 (ref 1.2–2.2)
Albumin: 4.3 g/dL (ref 3.6–4.6)
Alkaline Phosphatase: 93 IU/L (ref 44–121)
BUN/Creatinine Ratio: 15 (ref 10–24)
BUN: 16 mg/dL (ref 8–27)
Bilirubin Total: 0.4 mg/dL (ref 0.0–1.2)
CO2: 25 mmol/L (ref 20–29)
Calcium: 9.3 mg/dL (ref 8.6–10.2)
Chloride: 105 mmol/L (ref 96–106)
Creatinine, Ser: 1.08 mg/dL (ref 0.76–1.27)
Globulin, Total: 2.2 g/dL (ref 1.5–4.5)
Glucose: 103 mg/dL — ABNORMAL HIGH (ref 70–99)
Potassium: 5.3 mmol/L — ABNORMAL HIGH (ref 3.5–5.2)
Sodium: 145 mmol/L — ABNORMAL HIGH (ref 134–144)
Total Protein: 6.5 g/dL (ref 6.0–8.5)
eGFR: 69 mL/min/{1.73_m2} (ref >59)

## 2021-07-16 LAB — LIPID PANEL
Chol/HDL Ratio: 4.3 ratio (ref 0.0–5.0)
Cholesterol, Total: 156 mg/dL (ref 100–199)
HDL: 36 mg/dL — ABNORMAL LOW (ref >39)
LDL Chol Calc (NIH): 89 mg/dL (ref 0–99)
Triglycerides: 182 mg/dL — ABNORMAL HIGH (ref 0–149)
VLDL Cholesterol Cal: 31 mg/dL (ref 5–40)

## 2021-07-16 MED ORDER — TAMSULOSIN HCL 0.4 MG PO CAPS: 0.8000 mg | ORAL_CAPSULE | Freq: Every day | ORAL | 3 refills | Status: DC

## 2021-07-16 MED ORDER — PRAVASTATIN SODIUM 80 MG PO TABS: 80.0000 mg | ORAL_TABLET | Freq: Every day | ORAL | 3 refills | Status: DC

## 2021-07-16 NOTE — Progress Notes (Signed)
Assessment & Plan:  1. Chronic coronary artery disease - Well controlled on current regimen. Continue statin. - CMP14+EGFR - Lipid panel  2. Mixed hyperlipidemia - Well controlled on current regimen - pravastatin (PRAVACHOL) 80 MG tablet; Take 1 tablet (80 mg total) by mouth daily.  Dispense: 90 tablet; Refill: 3 - CBC with Differential/Platelet - CMP14+EGFR - Lipid panel  3. Benign prostatic hyperplasia with nocturia - Well controlled on current regimen - tamsulosin (FLOMAX) 0.4 MG CAPS capsule; Take 2 capsules (0.8 mg total) by mouth daily after supper.  Dispense: 180 capsule; Refill: 3 - CBC with Differential/Platelet - CMP14+EGFR  4. Dizziness - Well controlled on current regimen with meclizine prn - CMP14+EGFR  5. Osteopenia, unspecified location/ther specified disorders of bone density and structure, right ankle and foot - will consider addition of bisphosphonate vs calcium supplement pending DEXA results - DG [facility] DEXA  7. Need for immunization against influenza - influenza vaccine today   Return in about 6 months (around 01/14/2022) for follow-up of chronic medication conditions.  [name], NP Student  I personally was present during the history, physical exam, and medical decision-making activities of this service and have verified that the service and findings are accurately documented in the nurse practitioner student's note.  Hendricks Limes, MSN, APRN, FNP-C [facility]  Subjective:    Patient ID: Stephen Cross, male    DOB: 1941-06-14, 80 y.o.   MRN: [id number]  Patient Care Team: Loman Brooklyn, FNP as PCP - General (Family Medicine)   Chief Complaint:  Chief Complaint  Patient presents with   Hyperlipidemia   Hypertension    6 month follow up of chronic medical conditions     HPI: Stephen Cross is a 80 y.o. male presenting on 07/16/2021 for Hyperlipidemia and Hypertension (6 month follow up of chronic medical conditions  )  BPH: doing well with Flomax.   Hyperlipidemia: controlled with Pravastatin 80 mg daily.  Vertigo: controlled with meclizine.   Osteopenia: On 06/22/21 He fell off a ladder and broke his right ankle. He was placed in a splint and referred to orthopedic surgery for management. His imaging of the ankle was noted to be mildly osteopenic. He is to follow up with ortho every 2 weeks. His FRAX score is 17% risk of major osteoporotic fracture and 11% risk of hip fracture. He has not had a DEXA scan.   New complaints: None   Social history:  Relevant past medical, surgical, family and social history reviewed and updated as indicated. Interim medical history since our last visit reviewed.  Allergies and medications reviewed and updated.  DATA REVIEWED: CHART IN EPIC  ROS: Negative unless specifically indicated above in HPI.    Current Outpatient Medications:    aspirin 81 MG EC tablet, Take 1 tablet by mouth daily., Disp: , Rfl:    ibuprofen (ADVIL) 800 MG tablet, Take by mouth., Disp: , Rfl:    meclizine (ANTIVERT) 25 MG tablet, Take 1 tablet (25 mg total) by mouth 3 (three) times daily as needed for dizziness., Disp: 30 tablet, Rfl: 2   pravastatin (PRAVACHOL) 80 MG tablet, Take 1 tablet (80 mg total) by mouth daily., Disp: 90 tablet, Rfl: 3   tamsulosin (FLOMAX) 0.4 MG CAPS capsule, Take 2 capsules (0.8 mg total) by mouth daily after supper., Disp: 180 capsule, Rfl: 3   Allergies  Allergen Reactions   Codeine Nausea Only and Rash   Lisinopril Cough and Other (See Comments)  Past Medical History:  Diagnosis Date   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 ([facility]) 01/13/2012   Overview:  PEA arrest on induction   Syncope, cardiogenic 01/10/2012    Past Surgical History:  Procedure Laterality Date   HERNIA REPAIR     x 2    Social History   Socioeconomic History   Marital status: Married    Spouse name: patricia   Number of children: 4   Years of education: Not on  file   Highest education level: Not on file  Occupational History   Occupation: retired   Tobacco Use   Smoking status: Former   Smokeless tobacco: Never  Scientific laboratory technician Use: Never used  Substance and Sexual Activity   Alcohol use: Never   Drug use: Never   Sexual activity: Not on file  Other Topics Concern   Not on file  Social History Narrative   3 daughters and 1 son   Social Determinants of Health   Financial Resource Strain: Low Risk    Difficulty of Paying Living Expenses: Not hard at all  Food Insecurity: No Food Insecurity   Worried About Charity fundraiser in the Last Year: Never true   Arboriculturist in the Last Year: Never true  Transportation Needs: No Transportation Needs   Lack of Transportation (Medical): No   Lack of Transportation (Non-Medical): No  Physical Activity: Sufficiently Active   Days of Exercise per Week: 5 days   Minutes of Exercise per Session: 30 min  Stress: No Stress Concern Present   Feeling of Stress : Not at all  Social Connections: Socially Integrated   Frequency of Communication with Friends and Family: More than three times a week   Frequency of Social Gatherings with Friends and Family: More than three times a week   Attends Religious Services: More than 4 times per year   Active Member of [employer] or Organizations: Yes   Attends Music therapist: More than 4 times per year   Marital Status: Married  Human resources officer Violence: Not At Risk   Fear of Current or Ex-Partner: No   Emotionally Abused: No   Physically Abused: No   Sexually Abused: No        Objective:    BP (!) 148/87    Pulse 88    Temp (!) 97.2 F (36.2 C) (Temporal)    Ht 6' (1.829 m)    Wt 65 kg    SpO2 97%    BMI 19.42 kg/m   Wt Readings from Last 3 Encounters:  07/16/21 143 lb 3.2 oz (65 kg)  06/29/21 141 lb (64 kg)  01/15/21 141 lb 9.6 oz (64.2 kg)    Physical Exam Vitals reviewed.  Constitutional:      General: He is not in acute  distress.    Appearance: Normal appearance. He is underweight. He is not ill-appearing, toxic-appearing or diaphoretic.  HENT:     Head: Normocephalic and atraumatic.  Eyes:     General: No scleral icterus.       Right eye: No discharge.        Left eye: No discharge.     Conjunctiva/sclera: Conjunctivae normal.  Cardiovascular:     Rate and Rhythm: Normal rate and regular rhythm.     Heart sounds: Normal heart sounds. No murmur heard.   No friction rub. No gallop.  Pulmonary:     Effort: Pulmonary effort is normal. No respiratory distress.  Breath sounds: Normal breath sounds. No stridor. No wheezing, rhonchi or rales.  Musculoskeletal:        General: Normal range of motion.     Cervical back: Normal range of motion.     Right lower leg: Edema present.  Skin:    General: Skin is warm and dry.  Neurological:     Mental Status: He is alert and oriented to person, place, and time. Mental status is at baseline.  Psychiatric:        Mood and Affect: Mood normal.        Behavior: Behavior normal.        Thought Content: Thought content normal.        Judgment: Judgment normal.    Lab Results  Component Value Date   TSH 2.360 08/27/2019   Lab Results  Component Value Date   WBC 5.4 01/15/2021   HGB 15.1 01/15/2021   HCT 46.2 01/15/2021   MCV 87 01/15/2021   PLT 176 01/15/2021   Lab Results  Component Value Date   NA 144 01/15/2021   K 4.2 01/15/2021   CO2 21 01/15/2021   GLUCOSE 98 01/15/2021   BUN 14 01/15/2021   CREATININE 0.99 01/15/2021   BILITOT 0.5 01/15/2021   ALKPHOS 78 01/15/2021   AST 12 01/15/2021   ALT 6 01/15/2021   PROT 6.4 01/15/2021   ALBUMIN 4.1 01/15/2021   CALCIUM 8.8 01/15/2021   EGFR 77 01/15/2021   Lab Results  Component Value Date   CHOL 166 01/15/2021   Lab Results  Component Value Date   HDL 40 01/15/2021   Lab Results  Component Value Date   LDLCALC 107 (H) 01/15/2021   Lab Results  Component Value Date   TRIG 105  01/15/2021   Lab Results  Component Value Date   CHOLHDL 4.2 01/15/2021   No results found for: HGBA1C

## 2021-07-17 ENCOUNTER — Encounter: Payer: Self-pay | Admitting: Family Medicine

## 2021-07-17 DIAGNOSIS — M8580 Other specified disorders of bone density and structure, unspecified site: Secondary | ICD-10-CM

## 2021-07-17 HISTORY — DX: Other specified disorders of bone density and structure, unspecified site: M85.80

## 2021-08-04 LAB — HM DIABETES EYE EXAM

## 2021-08-05 ENCOUNTER — Ambulatory Visit (HOSPITAL_COMMUNITY)
Admission: RE | Admit: 2021-08-05 | Discharge: 2021-08-05 | Disposition: A | Discharge status: Home / Self Care | Payer: Medicare Other | Source: Ambulatory Visit | Attending: Family Medicine | Admitting: Family Medicine

## 2021-08-05 ENCOUNTER — Other Ambulatory Visit: Payer: Self-pay | Admitting: Family Medicine

## 2021-08-05 ENCOUNTER — Other Ambulatory Visit: Payer: Self-pay

## 2021-08-05 ENCOUNTER — Telehealth: Payer: Self-pay | Admitting: Family Medicine

## 2021-08-05 ENCOUNTER — Encounter: Payer: Self-pay | Admitting: Family Medicine

## 2021-08-05 ENCOUNTER — Ambulatory Visit (INDEPENDENT_AMBULATORY_CARE_PROVIDER_SITE_OTHER): Payer: Medicare Other | Admitting: Family Medicine

## 2021-08-05 VITALS — BP 175/75 | HR 63 | Temp 97.3°F | Ht 72.0 in | Wt 136.0 lb

## 2021-08-05 DIAGNOSIS — H53131 Sudden visual loss, right eye: Secondary | ICD-10-CM | POA: Insufficient documentation

## 2021-08-05 DIAGNOSIS — R030 Elevated blood-pressure reading, without diagnosis of hypertension: Secondary | ICD-10-CM | POA: Diagnosis not present

## 2021-08-05 DIAGNOSIS — Z8673 Personal history of transient ischemic attack (TIA), and cerebral infarction without residual deficits: Secondary | ICD-10-CM

## 2021-08-05 DIAGNOSIS — R410 Disorientation, unspecified: Secondary | ICD-10-CM

## 2021-08-05 DIAGNOSIS — H3411 Central retinal artery occlusion, right eye: Secondary | ICD-10-CM

## 2021-08-05 MED ORDER — CLOPIDOGREL BISULFATE 75 MG PO TABS: 75.0000 mg | ORAL_TABLET | Freq: Every day | ORAL | 2 refills | Status: DC

## 2021-08-05 NOTE — Progress Notes (Signed)
Assessment & Plan:  1. Sudden visual loss, right eye - CT HEAD WO CONTRAST (5MM); Future - Patient is scheduled to return to his eye doctor at the [place] in 2 weeks.   2. Confusion - family is concerned about confusion, but he does not appear altered in our discussions  3. Elevated BP without diagnosis of hypertension - normally well controlled without medication, suspect related to agitation   I have spent 55 minutes with patient and family discussing symptoms, visit to the [place] and ER yesterday, further workup, and plan of care. I did try to call Dr. Daun Peacock that he saw yesterday at the [facility], but was unable to reach her.  Follow up plan: Return if symptoms worsen or fail to improve.  [name], NP Student  I personally was present during the history, physical exam, and medical decision-making activities of this service and have verified that the service and findings are accurately documented in the nurse practitioner student's note.  Hendricks Limes, MSN, APRN, FNP-C [facility]  Subjective:   Patient ID: Stephen Cross, male    DOB: March 15, 1941, 81 y.o.   MRN: [id number]  HPI: Stephen Cross is a 81 y.o. male presenting on 08/05/2021 for Loss of Vision  He is accompanied by his daughter and granddaughter who are helping provide history.   On christmas, he was outside and he noticed his vision went completely black in his right eye. When he went inside he started to see flashes of light in the eye. He denies pain in this eye. He describes his vision today out of this eye as muted in color and blurry, but not doubled. He states the colors come in and out. He states that during the initial event he had some sharp pain under his right lower rib, but not since.   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 He was advised to go to the ER for evaluation of stroke, which he did, but he  left AMA after waiting for 6 hours. He denies confusion, abnormal weakness, or dizziness different from baseline vertigo. His blood pressure is high today in the office but previously well controlled, and he is very agitated.   Family reports confusion, patient does not appear to be altered in our discussions.    ROS: Negative unless specifically indicated above in HPI.   Relevant past medical history reviewed and updated as indicated.   Allergies and medications reviewed and updated.   Current Outpatient Medications:    aspirin 81 MG EC tablet, Take 1 tablet by mouth daily., Disp: , Rfl:    meclizine (ANTIVERT) 25 MG tablet, Take 1 tablet (25 mg total) by mouth 3 (three) times daily as needed for dizziness., Disp: 30 tablet, Rfl: 2   pravastatin (PRAVACHOL) 80 MG tablet, Take 1 tablet (80 mg total) by mouth daily., Disp: 90 tablet, Rfl: 3   tamsulosin (FLOMAX) 0.4 MG CAPS capsule, Take 2 capsules (0.8 mg total) by mouth daily after supper., Disp: 180 capsule, Rfl: 3  Allergies  Allergen Reactions   Codeine Nausea Only and Rash   Lisinopril Cough and Other (See Comments)    Objective:   BP (!) 175/75    Pulse 63    Temp (!) 97.3 F (36.3 C) (Temporal)    Ht 6' (1.829 m)    Wt 136 lb (61.7 kg)  SpO2 93%    BMI 18.44 kg/m    Physical Exam Vitals reviewed.  Constitutional:      General: He is not in acute distress.    Appearance: Normal appearance. He is underweight. He is not ill-appearing, toxic-appearing or diaphoretic.  HENT:     Head: Normocephalic and atraumatic.  Eyes:     General: No scleral icterus.       Right eye: No discharge.        Left eye: No discharge.     Conjunctiva/sclera: Conjunctivae normal.  Cardiovascular:     Rate and Rhythm: Normal rate and regular rhythm.     Heart sounds: Normal heart sounds. No murmur heard.   No friction rub. No gallop.  Pulmonary:     Effort: Pulmonary effort is normal. No respiratory distress.     Breath sounds: Normal  breath sounds. No stridor. No wheezing, rhonchi or rales.  Musculoskeletal:        General: Normal range of motion.     Cervical back: Normal range of motion.     Right lower leg: No edema.     Left lower leg: No edema.  Skin:    General: Skin is warm and dry.  Neurological:     Mental Status: He is alert and oriented to person, place, and time. Mental status is at baseline.     Sensory: Sensory deficit (right eye vision changes) present.     Motor: No weakness.     Coordination: Coordination normal.     Gait: Gait normal.  Psychiatric:        Mood and Affect: Mood normal.        Behavior: Behavior normal.        Thought Content: Thought content normal.        Judgment: Judgment normal.

## 2021-08-06 ENCOUNTER — Ambulatory Visit: Payer: Medicare Other | Admitting: Family Medicine

## 2021-08-10 ENCOUNTER — Ambulatory Visit (INDEPENDENT_AMBULATORY_CARE_PROVIDER_SITE_OTHER): Payer: Medicare Other | Admitting: Neurology

## 2021-08-10 ENCOUNTER — Other Ambulatory Visit: Payer: Self-pay

## 2021-08-10 ENCOUNTER — Encounter: Payer: Self-pay | Admitting: *Deleted

## 2021-08-10 ENCOUNTER — Encounter: Payer: Self-pay | Admitting: Neurology

## 2021-08-10 VITALS — BP 142/84 | HR 108 | Ht 72.0 in | Wt 140.8 lb

## 2021-08-10 DIAGNOSIS — H3411 Central retinal artery occlusion, right eye: Secondary | ICD-10-CM

## 2021-08-10 DIAGNOSIS — H4743 Disorders of optic chiasm in (due to) vascular disorders: Secondary | ICD-10-CM

## 2021-08-10 DIAGNOSIS — I6381 Other cerebral infarction due to occlusion or stenosis of small artery: Secondary | ICD-10-CM | POA: Diagnosis not present

## 2021-08-10 DIAGNOSIS — H5360 Unspecified night blindness: Secondary | ICD-10-CM

## 2021-08-10 DIAGNOSIS — I6312 Cerebral infarction due to embolism of basilar artery: Secondary | ICD-10-CM

## 2021-08-10 DIAGNOSIS — I639 Cerebral infarction, unspecified: Secondary | ICD-10-CM

## 2021-08-10 NOTE — Patient Instructions (Signed)
I had a long d/w patient about his recent stroke, risk for recurrent stroke/TIAs, personally independently reviewed imaging studies and stroke evaluation results and answered questions.Continue aspirin 81 mg daily and Plavix 75 mg daily  for 3 weeks followed by Plavix alone secondary stroke prevention and maintain strict control of hypertension with blood pressure goal below 130/90, diabetes with hemoglobin A1c goal below 6.5% and lipids with LDL cholesterol goal below 70 mg/dL. I also advised the patient to eat a healthy diet with plenty of whole grains, cereals, fruits and vegetables, exercise regularly and maintain ideal body weight .check MRI scan of the brain, MR angiogram of the brain and neck, loop recorder.  For paroxysmal A. fib schedule appointment for echocardiogram.  Continue follow-up with ophthalmologist at the [facility] .patient was advised not to drive.  Followup in the future with me in 3 to 4 months or call earlier. Stroke Prevention Some medical conditions and behaviors can lead to a higher chance of having a stroke. You can help prevent a stroke by eating healthy, exercising, not smoking, and managing any medical conditions you have. Stroke is a leading cause of functional impairment. Primary prevention is particularly important because a majority of strokes are first-time events. Stroke changes the lives of not only those who experience a stroke but also their family and other caregivers. How can this condition affect me? A stroke is a medical emergency and should be treated right away. A stroke can lead to brain damage and can sometimes be life-threatening. If a person gets medical treatment right away, there is a better chance of surviving and recovering from a stroke. What can increase my risk? The following medical conditions may increase your risk of a stroke: Cardiovascular disease. High blood pressure (hypertension). Diabetes. High cholesterol. Sickle cell disease. Blood clotting  disorders (hypercoagulable state). Obesity. Sleep disorders (obstructive sleep apnea). Other risk factors include: Being older than age 96. Having a history of blood clots, stroke, or mini-stroke (transient ischemic attack, TIA). Genetic factors, such as race, ethnicity, or a family history of stroke. Smoking cigarettes or using other tobacco products. Taking birth control pills, especially if you also use tobacco. Heavy use of alcohol or drugs, especially cocaine and methamphetamine. Physical inactivity. What actions can I take to prevent this? Manage your health conditions High cholesterol levels. Eating a healthy diet is important for preventing high cholesterol. If cholesterol cannot be managed through diet alone, you may need to take medicines. Take any prescribed medicines to control your cholesterol as told by your health care provider. Hypertension. To reduce your risk of stroke, try to keep your blood pressure below 130/80. Eating a healthy diet and exercising regularly are important for controlling blood pressure. If these steps are not enough to manage your blood pressure, you may need to take medicines. Take any prescribed medicines to control hypertension as told by your health care provider. Ask your health care provider if you should monitor your blood pressure at home. Have your blood pressure checked every year, even if your blood pressure is normal. Blood pressure increases with age and some medical conditions. Diabetes. Eating a healthy diet and exercising regularly are important parts of managing your blood sugar (glucose). If your blood sugar cannot be managed through diet and exercise, you may need to take medicines. Take any prescribed medicines to control your diabetes as told by your health care provider. Get evaluated for obstructive sleep apnea. Talk to your health care provider about getting a sleep evaluation if you  snore a lot or have excessive sleepiness. Make  sure that any other medical conditions you have, such as atrial fibrillation or atherosclerosis, are managed. Nutrition Follow instructions from your health care provider about what to eat or drink to help manage your health condition. These instructions may include: Reducing your daily calorie intake. Limiting how much salt (sodium) you use to 1,500 milligrams (mg) each day. Using only healthy fats for cooking, such as olive oil, canola oil, or sunflower oil. Eating healthy foods. You can do this by: Choosing foods that are high in fiber, such as whole grains, and fresh fruits and vegetables. Eating at least 5 servings of fruits and vegetables a day. Try to fill one-half of your plate with fruits and vegetables at each meal. Choosing lean protein foods, such as lean cuts of meat, poultry without skin, fish, tofu, beans, and nuts. Eating low-fat dairy products. Avoiding foods that are high in sodium. This can help lower blood pressure. Avoiding foods that have saturated fat, trans fat, and cholesterol. This can help prevent high cholesterol. Avoiding processed and prepared foods. Counting your daily carbohydrate intake.  Lifestyle If you drink alcohol: Limit how much you have to: 0-1 drink a day for women who are not pregnant. 0-2 drinks a day for men. Know how much alcohol is in your drink. In the [place] one drink equals one 12 oz bottle of beer (344mL), one 5 oz glass of wine (142mL), or one 1 oz glass of hard liquor (79mL). Do not use any products that contain nicotine or tobacco. These products include cigarettes, chewing tobacco, and vaping devices, such as e-cigarettes. If you need help quitting, ask your health care provider. Avoid secondhand smoke. Do not use drugs. Activity  Try to stay at a healthy weight. Get at least 30 minutes of exercise on most days, such as: Fast walking. Biking. Swimming. Medicines Take over-the-counter and prescription medicines only as told by your  health care provider. Aspirin or blood thinners (antiplatelets or anticoagulants) may be recommended to reduce your risk of forming blood clots that can lead to stroke. Avoid taking birth control pills. Talk to your health care provider about the risks of taking birth control pills if: You are over 8 years old. You smoke. You get very bad headaches. You have had a blood clot. Where to find more information American Stroke Association: www.strokeassociation.org Get help right away if: You or a loved one has any symptoms of a stroke. "BE FAST" is an easy way to remember the main warning signs of a stroke: B - Balance. Signs are dizziness, sudden trouble walking, or loss of balance. E - Eyes. Signs are trouble seeing or a sudden change in vision. F - Face. Signs are sudden weakness or numbness of the face, or the face or eyelid drooping on one side. A - Arms. Signs are weakness or numbness in an arm. This happens suddenly and usually on one side of the body. S - Speech. Signs are sudden trouble speaking, slurred speech, or trouble understanding what people say. T - Time. Time to call emergency services. Write down what time symptoms started. You or a loved one has other signs of a stroke, such as: A sudden, severe headache with no known cause. Nausea or vomiting. Seizure. These symptoms may represent a serious problem that is an emergency. Do not wait to see if the symptoms will go away. Get medical help right away. Call your local emergency services (911 in the [place] Do not  drive yourself to the hospital. Summary You can help to prevent a stroke by eating healthy, exercising, not smoking, limiting alcohol intake, and managing any medical conditions you may have. Do not use any products that contain nicotine or tobacco. These include cigarettes, chewing tobacco, and vaping devices, such as e-cigarettes. If you need help quitting, ask your health care provider. Remember "BE FAST" for warning  signs of a stroke. Get help right away if you or a loved one has any of these signs. This information is not intended to replace advice given to you by your health care provider. Make sure you discuss any questions you have with your health care provider. Document Revised: 02/18/2020 Document Reviewed: 02/18/2020 Elsevier Patient Education  [place].

## 2021-08-10 NOTE — Progress Notes (Signed)
[facility] [address] [place]. [place] [address] [phone]       OFFICE CONSULT NOTE  [name] Date of Birth:  1940/11/26 Medical Record Number:  [id number]   Referring MD: Hendricks Limes, FNP  Reason for Referral: Vision loss and stroke  HPI: Stephen Cross is a 81 year old Caucasian male seen today for initial office consultation visit.  Is accompanied by his wife and daughter.  History is obtained from them and review of electronic medical records and opossum reviewed pertinent available imaging films in PACS.  He has a past medical history of cardiomyopathy, syncope, cardiac arrest on induction of stress test, postoperative transient atrial fibrillation, osteopenia.  Patient states on 07/26/2021 with developed sudden onset of painless vision loss in the right eye.  For about 2 hours he could not see anything from that eye at all but then subsequently he started noticing a large blob occupying most of his central vision and only when he tilted his I could see slightly from the extreme margins and this has persisted.  Vision has not improved over the last 10 days or so.  He was seen by ophthalmologist at the [facility] in [place] who referred him urgently to the ER and patient sat in the [facility] ER for a few hours and became annoyed and left.  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  Review of electronic medical records in PACS and reviewed an MRI scan report on 03/27/2016 which had shown encephalomalacia in the left occipital region along with remote right frontoparietal white matter lacunar infarct.  LDL cholesterol on 07/16/2021 is 89 mg percent and triglycerides of 182  mg percent.  Patient had been on aspirin following an episode of vertigo in 2017 when you are evaluated at [place] and based on his old strokes on the MRI started on it.  Recently Plavix was added few days ago by his primary care physician.  He is tolerating them well without bruising or bleeding.  He is also on Pravachol 80 mg which is tolerating well without side effects.  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 all other systems negative  PMH:  Past Medical History:  Diagnosis Date   A-fib ([place]) 01/13/2012   Post operative AF   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 ([place]) 01/13/2012   PEA arrest on induction of stress test   Cardiomyopathy ([place]) 02/21/2012   Osteopenia 07/17/2021   Syncope, cardiogenic 01/10/2012    Social History:  Social History   Socioeconomic History   Marital status: Married    Spouse name: patricia   Number of children: 4   Years of education: Not on file   Highest education level: Not on file  Occupational History   Occupation: retired   Tobacco Use   Smoking status: Former   Smokeless tobacco: Never  Scientific laboratory technician Use: Never used  Substance and Sexual Activity   Alcohol use: Never   Drug use: Never   Sexual activity: Not on file  Other Topics Concern   Not on file  Social History Narrative   3 daughters and 1 son  Social Determinants of Health   Financial Resource Strain: Low Risk    Difficulty of Paying Living Expenses: Not hard at all  Food Insecurity: No Food Insecurity   Worried About Charity fundraiser in the Last Year: Never true   [place] in the Last Year: Never true  Transportation Needs: No Transportation Needs   Lack of Transportation (Medical): No   Lack of Transportation (Non-Medical): No  Physical Activity: Sufficiently Active   Days of Exercise per Week: 5 days   Minutes of Exercise per Session: 30 min  Stress: No Stress  Concern Present   Feeling of Stress : Not at all  Social Connections: Socially Integrated   Frequency of Communication with Friends and Family: More than three times a week   Frequency of Social Gatherings with Friends and Family: More than three times a week   Attends Religious Services: More than 4 times per year   Active Member of [employer] or Organizations: Yes   Attends Music therapist: More than 4 times per year   Marital Status: Married  Human resources officer Violence: Not At Risk   Fear of Current or Ex-Partner: No   Emotionally Abused: No   Physically Abused: No   Sexually Abused: No    Medications:   Current Outpatient Medications on File Prior to Visit  Medication Sig Dispense Refill   aspirin 81 MG EC tablet Take 1 tablet by mouth daily.     clopidogrel (PLAVIX) 75 MG tablet Take 1 tablet (75 mg total) by mouth daily. 30 tablet 2   meclizine (ANTIVERT) 25 MG tablet Take 1 tablet (25 mg total) by mouth 3 (three) times daily as needed for dizziness. 30 tablet 2   pravastatin (PRAVACHOL) 80 MG tablet Take 1 tablet (80 mg total) by mouth daily. 90 tablet 3   tamsulosin (FLOMAX) 0.4 MG CAPS capsule Take 2 capsules (0.8 mg total) by mouth daily after supper. 180 capsule 3   No current facility-administered medications on file prior to visit.    Allergies:   Allergies  Allergen Reactions   Codeine Nausea Only and Rash   Lisinopril Cough and Other (See Comments)    Physical Exam General: Frail elderly Caucasian male, seated, in no evident distress Head: head normocephalic and atraumatic.   Neck: supple with no carotid or supraclavicular bruits Cardiovascular: regular rate and rhythm, no murmurs Musculoskeletal: no deformity Skin:  no rash/petichiae Vascular:  Normal pulses all extremities  Neurologic Exam Mental Status: Awake and fully alert. Oriented to place and time. Recent and remote memory intact. Attention span, concentration and fund of knowledge  appropriate. Mood and affect appropriate.  Cranial Nerves: Fundoscopic exam reveals pale right optic disc and normal margins on the left.  Right pupil is 4 mm sluggishly reactive left is 3 mm briskly reactive.  Vision acuity significantly diminished in the right eye with patient having lost 90% of central vision and can only detect periphery.  Light perception is present in the right eye. Extraocular movements full without nystagmus. Visual fields full to confrontation in the left eye.Marland Kitchen Hearing intact. Facial sensation intact. Face, tongue, palate moves normally and symmetrically.  Motor: Normal bulk and tone. Normal strength in all tested extremity muscles. Sensory.: intact to touch , pinprick , position and vibratory sensation.  Coordination: Rapid alternating movements normal in all extremities. Finger-to-nose and heel-to-shin performed accurately bilaterally. Gait and Station: Arises from chair without difficulty. Stance is normal. Gait demonstrates normal stride length and balance .  Able to heel, toe and tandem walk with moderate difficulty.  Reflexes: 1+ and symmetric. Toes downgoing.   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 fib.  Vascular risk factors of hyperlipidemia , cardiomyopathy, transient postop A. fib and silent cerebrovascular disease.     PLAN:I had a long d/w patient about his recent stroke, risk for recurrent stroke/TIAs, personally independently reviewed imaging studies and stroke evaluation results and answered questions.Continue aspirin 81 mg daily and Plavix 75 mg daily  for 3 weeks followed by Plavix alone secondary stroke prevention and maintain strict control of hypertension with blood pressure goal below 130/90, diabetes with  hemoglobin A1c goal below 6.5% and lipids with LDL cholesterol goal below 70 mg/dL. I also advised the patient to eat a healthy diet with plenty of whole grains, cereals, fruits and vegetables, exercise regularly and maintain ideal body weight .check MRI scan of the brain, MR angiogram of the brain and neck, loop recorder to look for paroxysmal A. fib schedule appointment for echocardiogram.  Continue follow-up with ophthalmologist at the [facility] .patient was advised not to drive.  Followup in the future with me in 3 to 4 months or call earlier.  Greater than 50% time during this 50-minute consultation visit was spent in counseling and coordination of care about his vision loss as well as previous silent strokes and discussion about stroke prevention, treatment and answering questions.  Antony Contras, MD Note: This document was prepared with digital dictation and possible smart phrase technology. Any transcriptional errors that result from this process are unintentional.

## 2021-08-12 ENCOUNTER — Telehealth: Payer: Self-pay | Admitting: Family Medicine

## 2021-08-13 ENCOUNTER — Other Ambulatory Visit: Payer: Medicare Other

## 2021-08-13 DIAGNOSIS — H53131 Sudden visual loss, right eye: Secondary | ICD-10-CM

## 2021-08-13 DIAGNOSIS — H3411 Central retinal artery occlusion, right eye: Secondary | ICD-10-CM

## 2021-08-14 LAB — BMP8+EGFR
BUN/Creatinine Ratio: 16 (ref 10–24)
BUN: 19 mg/dL (ref 8–27)
CO2: 25 mmol/L (ref 20–29)
Calcium: 9.4 mg/dL (ref 8.6–10.2)
Chloride: 105 mmol/L (ref 96–106)
Creatinine, Ser: 1.17 mg/dL (ref 0.76–1.27)
Glucose: 132 mg/dL — ABNORMAL HIGH (ref 70–99)
Potassium: 4.5 mmol/L (ref 3.5–5.2)
Sodium: 144 mmol/L (ref 134–144)
eGFR: 63 mL/min/{1.73_m2} (ref >59)

## 2021-08-14 LAB — C-REACTIVE PROTEIN: CRP: 5 mg/L (ref 0–10)

## 2021-08-14 LAB — SEDIMENTATION RATE: Sed Rate: 2 mm/hr (ref 0–30)

## 2021-08-16 ENCOUNTER — Ambulatory Visit (INDEPENDENT_AMBULATORY_CARE_PROVIDER_SITE_OTHER): Payer: Medicare Other

## 2021-08-16 DIAGNOSIS — I493 Ventricular premature depolarization: Secondary | ICD-10-CM | POA: Diagnosis not present

## 2021-08-16 DIAGNOSIS — H5360 Unspecified night blindness: Secondary | ICD-10-CM

## 2021-08-16 DIAGNOSIS — I480 Paroxysmal atrial fibrillation: Secondary | ICD-10-CM | POA: Diagnosis not present

## 2021-08-16 DIAGNOSIS — I639 Cerebral infarction, unspecified: Secondary | ICD-10-CM

## 2021-08-16 DIAGNOSIS — I6381 Other cerebral infarction due to occlusion or stenosis of small artery: Secondary | ICD-10-CM | POA: Diagnosis not present

## 2021-08-17 NOTE — Telephone Encounter (Signed)
Attempted to contact - Na  Attempts have been made - no call back. This encounter will be closed.

## 2021-08-25 ENCOUNTER — Ambulatory Visit
Admission: RE | Admit: 2021-08-25 | Discharge: 2021-08-25 | Disposition: A | Discharge status: Home / Self Care | Payer: Medicare Other | Source: Ambulatory Visit | Attending: Neurology | Admitting: Neurology

## 2021-08-25 ENCOUNTER — Telehealth: Payer: Self-pay | Admitting: Neurology

## 2021-08-25 ENCOUNTER — Other Ambulatory Visit: Payer: Self-pay

## 2021-08-25 ENCOUNTER — Telehealth: Payer: Self-pay | Admitting: *Deleted

## 2021-08-25 DIAGNOSIS — I639 Cerebral infarction, unspecified: Secondary | ICD-10-CM | POA: Diagnosis not present

## 2021-08-25 MED ORDER — GADOBENATE DIMEGLUMINE 529 MG/ML IV SOLN
14.0000 mL | Freq: Once | INTRAVENOUS | Status: AC | PRN
  Administered 2021-08-25: 14 mL via INTRAVENOUS

## 2021-08-25 NOTE — Telephone Encounter (Signed)
Patient's wife called stating the electrodes for the cardiac monitor were removed prior to patient's MRI today and it was noted that there is significant skin irritation under the electrodes with blisters on both sides were the patch was at. Patient's wife reports that the electrode sites were changed 3 times already.  She wants to know where she can place them at know.

## 2021-08-25 NOTE — Telephone Encounter (Signed)
Pt's wife has called to report that pt is currently having a MRI and when the probes were taken off from the heart monitor she noticed skin irritation from where the probes go into the shocker.  This is concerning to wife and she doesn't want pt to continue to wear the monitor.  Please call to address if pt needs to continue to wear the monitor.

## 2021-08-25 NOTE — Telephone Encounter (Signed)
Patient has blisters from cardiac event monitor strips.  He has only been placing the strips in the vertical position. Explained to Mrs. Linhares the strip placement can be changed to the horizontal placement on the upper left chest.  One arrow must be pointing up.  There are pictures in the instruction manual she can show her husband as he has been resistant to changing the position of the strip. [employer] representative was sent email to please ship patient a few Hydrocolloid strips which will be more gentle to the skin.

## 2021-08-25 NOTE — Telephone Encounter (Signed)
Please reach out to cardiology for an alternative, may be another electrode glue will work better.

## 2021-08-25 NOTE — Telephone Encounter (Signed)
I called patient.  I spoke with patient's wife, Elease Hashimoto.  Patient's wife reports they were able to speak with the cardiology office.  The cardiology office recommended a different way of placing the electrodes.  I advised her that our covering physician Dr. Vickey Huger also recommended the same thing.  Patient will call us back if he has any further questions or concerns.

## 2021-08-25 NOTE — Telephone Encounter (Signed)
I do believe that it is important that Stephen Cross use the loop recorder for the duration of the order, especially since Dr. Pearlean Brownie is concerned for pAfib. Stephen Cross could also contact cardiology office to find out if they have recommendations for skin irritation from the loop recorder.   I called Stephen Cross, spoke with Stephen Cross. The electrodes for the cardiac monitor were removed prior to Stephen MRI today and it was noted that there is significant skin irritation under the electrodes with a yellow pus filled blister on both sites. Stephen Cross reports that the electrode sites were changed 3 days ago after poor signal from the monitor.  Stephen Cross is reluctant to replace the electrodes due to the skin irritation. I recommended that she call [facility] at [phone], as they were the ones to mail him the cardiac monitor for their suggestions.  I will also ask Dr. Pearlean Brownie for review and consideration.

## 2021-09-01 ENCOUNTER — Telehealth: Payer: Self-pay

## 2021-09-01 ENCOUNTER — Telehealth: Payer: Self-pay | Admitting: Family Medicine

## 2021-09-01 ENCOUNTER — Other Ambulatory Visit: Payer: Self-pay | Admitting: Neurology

## 2021-09-01 MED ORDER — ASPIRIN EC 81 MG PO TBEC: 81.0000 mg | DELAYED_RELEASE_TABLET | Freq: Every day | ORAL | 11 refills | Status: DC

## 2021-09-01 NOTE — Telephone Encounter (Signed)
FYI  Patients wife called back, she spoke with the imaging center who advised that we should have the results from the MRI. She demanded that she be given the results. I advised that we have the images and the report, but the doctor has not read them yet, and we could not do anything until they were read by the patients provider. She asked for a code for Mychart, it was texted to her.

## 2021-09-01 NOTE — Progress Notes (Signed)
I returned phone call from patient's wife and communicated results of MRI scan of the brain and MRA of the brain and neck with the patient and his wife.  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  The patient had episode of transient left eye vision loss on 07/29/2021 which may perhaps have related to this.  I discussed treatment options including aggressive medical therapy versus angioplasty stenting.  Patient is not keen to undergo diagnostic angiogram considering the plasty stenting at this time.  I recommend he take aspirin and Plavix for 3 months at least with vision loss episodes and maintain aggressive risk factor modification with blood pressure goal below 140/90, LDL cholesterol below 70 mg/dL A1c goal below 6.5%.  He was advised to call me if he had any recurrent vision loss episodes.  He voiced understanding

## 2021-09-01 NOTE — Telephone Encounter (Signed)
Patients wife called in asking to speak with 'Shelly". I advised that there was no one by that name at our office, and advised that in the patients chart, it looks like a shelly from his cardiologists office had reached out. I asked if she would like to try calling them and she advised that she needed to speak with Dr Lenell Antu nurse because they have not heard back regarding the MRI the patient had "weeks ago".  No nurse was available, so I advised that I could have someone give her a call back, and that based on the chart, we had not gotten results back from the provider on the MRI. Patient got very irritated that we did not have results from testing that was done "weeks ago". I confirmed that the patient just had his MRI done 1/24 and that I would have a nurse reach out to the patient to confirm whether or not we received results from the MRI.  Please give the wife a call back at your earliest convenience.

## 2021-09-01 NOTE — Telephone Encounter (Signed)
Provider has not resulted yet.

## 2021-09-01 NOTE — Telephone Encounter (Signed)
Several attempts made and number not working

## 2021-09-01 NOTE — Telephone Encounter (Signed)
Pt wants to know why he has not received results from his referral for MRI. He stated that it has been two weeks ago and he was told that Stephen Cross would get the results and call him when they were ready. Please call back and advise.

## 2021-09-01 NOTE — Telephone Encounter (Signed)
Stephen Cross called back in again to make sure that someone would reach out to her to explain what was found on [facility] MRI. She accessed the results through mychart. I advised that we are waiting on Dr Pearlean Brownie to review the report, and that once he does, he will send a message to the nurses to give them a call.  She stated she did not want to wait until their follow up appointment in May to discuss the MRIs. I reiterated multiple times that a nurse will give them a call once we hear back from Dr Pearlean Brownie. I advised that Dr Pearlean Brownie is working in the hospital this week but that he was aware that they were reaching out regarding this.  They are also requesting that the MRI reports be sent to their PCP as well.

## 2021-09-01 NOTE — Telephone Encounter (Signed)
This was ordered by his neurologist. They will need to give him his results.

## 2021-09-01 NOTE — Telephone Encounter (Signed)
Please review and advise, MRI completed on 08/25/21.

## 2021-09-02 NOTE — Telephone Encounter (Signed)
Patient states he got the results yesterday.

## 2021-09-21 ENCOUNTER — Other Ambulatory Visit: Payer: Self-pay | Admitting: Neurology

## 2021-09-21 DIAGNOSIS — I639 Cerebral infarction, unspecified: Secondary | ICD-10-CM

## 2021-09-21 DIAGNOSIS — H5360 Unspecified night blindness: Secondary | ICD-10-CM

## 2021-09-21 DIAGNOSIS — I6381 Other cerebral infarction due to occlusion or stenosis of small artery: Secondary | ICD-10-CM

## 2021-09-21 DIAGNOSIS — I480 Paroxysmal atrial fibrillation: Secondary | ICD-10-CM

## 2021-09-21 DIAGNOSIS — I493 Ventricular premature depolarization: Secondary | ICD-10-CM

## 2021-09-28 ENCOUNTER — Other Ambulatory Visit: Payer: Self-pay | Admitting: Family Medicine

## 2021-09-28 DIAGNOSIS — Z8673 Personal history of transient ischemic attack (TIA), and cerebral infarction without residual deficits: Secondary | ICD-10-CM

## 2021-10-14 ENCOUNTER — Ambulatory Visit (INDEPENDENT_AMBULATORY_CARE_PROVIDER_SITE_OTHER): Payer: Medicare Other | Admitting: Family Medicine

## 2021-10-14 ENCOUNTER — Encounter: Payer: Self-pay | Admitting: Family Medicine

## 2021-10-14 ENCOUNTER — Telehealth: Payer: Self-pay | Admitting: Neurology

## 2021-10-14 VITALS — BP 160/84 | HR 90 | Temp 96.9°F | Ht 73.0 in | Wt 138.0 lb

## 2021-10-14 DIAGNOSIS — I10 Essential (primary) hypertension: Secondary | ICD-10-CM | POA: Diagnosis not present

## 2021-10-14 DIAGNOSIS — W57XXXA Bitten or stung by nonvenomous insect and other nonvenomous arthropods, initial encounter: Secondary | ICD-10-CM

## 2021-10-14 NOTE — Progress Notes (Signed)
? ?Assessment & Plan:  ?1. Tick bite of multiple sites ?- Lyme Disease Serology w/Reflex ?- [place] spotted fvr abs pnl(IgG+IgM) ? ?2. Essential hypertension ?Patient to bring his home blood pressure cuff to the office so we can compare to ours for accuracy. As long as it is accurate, he needs to monitor his blood pressure at home more consistently. Discussed if he is staying >150/90 he needs medication to treat. He is reluctant to start a medication for blood pressure. We discussed uncontrolled blood pressure as a contributing factor of strokes which he has already had recently.  ? ? ?Follow up plan: Return ASAP, for nurse visit to compare home BP cuff. ? ?Deliah Boston, MSN, APRN, FNP-C ?[facility] ? ?Subjective:  ? ?Patient ID: Stephen Cross, male    DOB: 1940/09/24, 81 y.o.   MRN: [id number] ? ?HPI: ?Stephen Cross is a 81 y.o. 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 ) ? ?Patient reports ticks were worse for him over the past year. He would like lab work to assess for tick borne illnesses.  ? ?Blood pressure: patient reports BP readings at home are 130s; he does not recall diastolic readings.  ? ? ?ROS: Negative unless specifically indicated above in HPI.  ? ?Relevant past medical history reviewed and updated as indicated.  ? ?Allergies and medications reviewed and updated. ? ? ?Current Outpatient Medications:  ?  clopidogrel (PLAVIX) 75 MG tablet, TAKE ONE (1) TABLET BY MOUTH EVERY DAY, Disp: 30 tablet, Rfl: 2 ?  meclizine (ANTIVERT) 25 MG tablet, Take 1 tablet (25 mg total) by mouth 3 (three) times daily as needed for dizziness., Disp: 30 tablet, Rfl: 2 ?  pravastatin (PRAVACHOL) 80 MG tablet, Take 1 tablet (80 mg total) by mouth daily., Disp: 90 tablet, Rfl: 3 ?  tamsulosin (FLOMAX) 0.4 MG CAPS capsule, Take 2 capsules (0.8 mg total) by mouth daily after  supper., Disp: 180 capsule, Rfl: 3 ?  aspirin 81 MG EC tablet, Take 1 tablet by mouth daily. (Patient not taking: Reported on 10/14/2021), Disp: , Rfl:  ?  aspirin EC 81 MG tablet, Take 1 tablet (81 mg total) by mouth daily. Swallow whole. (Patient not taking: Reported on 10/14/2021), Disp: 30 tablet, Rfl: 11 ? ?Allergies  ?Allergen Reactions  ? Codeine Nausea Only and Rash  ? Lisinopril Cough and Other (See Comments)  ? ? ?Objective:  ? ?BP (!) 160/84   Pulse 90   Temp (!) 96.9 ?F (36.1 ?C) (Temporal)   Ht 6\' 1"  (1.854 m)   Wt 138 lb (62.6 kg)   SpO2 96%   BMI 18.21 kg/m?   ? ?Physical Exam ?Vitals reviewed.  ?Constitutional:   ?   General: He is not in acute distress. ?   Appearance: Normal appearance. He is not ill-appearing, toxic-appearing or diaphoretic.  ?HENT:  ?   Head: Normocephalic and atraumatic.  ?Eyes:  ?   General: No scleral icterus.    ?   Right eye: No discharge.     ?   Left eye: No discharge.  ?   Conjunctiva/sclera: Conjunctivae normal.  ?Cardiovascular:  ?   Rate and Rhythm: Normal rate.  ?Pulmonary:  ?   Effort: Pulmonary effort is normal. No respiratory distress.  ?Musculoskeletal:     ?   General: Normal range of motion.  ?  Cervical back: Normal range of motion.  ?Skin: ?   General: Skin is warm and dry.  ?Neurological:  ?   Mental Status: He is alert and oriented to person, place, and time. Mental status is at baseline.  ?Psychiatric:     ?   Mood and Affect: Mood normal.     ?   Behavior: Behavior normal.     ?   Thought Content: Thought content normal.     ?   Judgment: Judgment normal.  ? ? ? ? ? ? ?

## 2021-10-14 NOTE — Telephone Encounter (Signed)
Pt's wife states they have not heard from anyone about the results to the Cardiac Event Monitor, please call with results. ?

## 2021-10-15 ENCOUNTER — Ambulatory Visit: Payer: Medicare Other

## 2021-10-15 ENCOUNTER — Telehealth: Payer: Self-pay | Admitting: Neurology

## 2021-10-15 NOTE — Progress Notes (Cosign Needed)
Patient here today to compare blood pressure reading on our machine versus his.  Reading on our machine is 157/85, pulse 98.  Reading on his machine is 160/89. ?

## 2021-10-15 NOTE — Telephone Encounter (Signed)
Referral sent to [facility] [phone]. ?

## 2021-10-15 NOTE — Telephone Encounter (Signed)
I spoke to the patient. He verbalized understanding of the results. Agreeable to the cardiology referral. He would like to be sent back to his previous cardiologist at [facility], Doristine Bosworth, MD. Preference specified in order.  ?

## 2021-10-15 NOTE — Addendum Note (Signed)
Addended by: Lilla Shook on: 10/15/2021 09:57 AM ? ? Modules accepted: Orders ? ?

## 2021-10-16 LAB — ROCKY MTN SPOTTED FVR ABS PNL(IGG+IGM)
RMSF IgG: POSITIVE — AB
RMSF IgM: 0.36 index (ref 0.00–0.89)

## 2021-10-16 LAB — RMSF, IGG, IFA: RMSF, IGG, IFA: <1:64 {titer}

## 2021-10-16 LAB — LYME DISEASE SEROLOGY W/REFLEX: Lyme Total Antibody EIA: NEGATIVE

## 2021-12-07 ENCOUNTER — Telehealth: Payer: Self-pay | Admitting: Neurology

## 2021-12-07 NOTE — Telephone Encounter (Signed)
Unable to reach on home #, Lvm on cell # and sent mychart message asking pt to cb and r/s 5/17 appt- Dr. Pearlean Brownie out. ?

## 2021-12-16 ENCOUNTER — Ambulatory Visit: Payer: Medicare Other | Admitting: Neurology

## 2022-01-14 ENCOUNTER — Ambulatory Visit (INDEPENDENT_AMBULATORY_CARE_PROVIDER_SITE_OTHER): Payer: Medicare Other | Admitting: Family Medicine

## 2022-01-14 ENCOUNTER — Encounter: Payer: Self-pay | Admitting: Family Medicine

## 2022-01-14 VITALS — BP 182/85 | HR 85 | Temp 96.9°F | Ht 73.0 in | Wt 131.6 lb

## 2022-01-14 DIAGNOSIS — M8589 Other specified disorders of bone density and structure, multiple sites: Secondary | ICD-10-CM | POA: Diagnosis not present

## 2022-01-14 DIAGNOSIS — E782 Mixed hyperlipidemia: Secondary | ICD-10-CM

## 2022-01-14 DIAGNOSIS — Z8673 Personal history of transient ischemic attack (TIA), and cerebral infarction without residual deficits: Secondary | ICD-10-CM | POA: Insufficient documentation

## 2022-01-14 DIAGNOSIS — N401 Enlarged prostate with lower urinary tract symptoms: Secondary | ICD-10-CM

## 2022-01-14 DIAGNOSIS — I2510 Atherosclerotic heart disease of native coronary artery without angina pectoris: Secondary | ICD-10-CM | POA: Diagnosis not present

## 2022-01-14 DIAGNOSIS — I10 Essential (primary) hypertension: Secondary | ICD-10-CM

## 2022-01-14 DIAGNOSIS — R42 Dizziness and giddiness: Secondary | ICD-10-CM

## 2022-01-14 DIAGNOSIS — R351 Nocturia: Secondary | ICD-10-CM

## 2022-01-14 MED ORDER — CLOPIDOGREL BISULFATE 75 MG PO TABS: 75.0000 mg | ORAL_TABLET | Freq: Every day | ORAL | 3 refills | Status: DC

## 2022-01-14 MED ORDER — AMLODIPINE BESYLATE 5 MG PO TABS: 5.0000 mg | ORAL_TABLET | Freq: Every day | ORAL | 2 refills | Status: DC

## 2022-01-14 MED ORDER — MECLIZINE HCL 25 MG PO TABS: 25.0000 mg | ORAL_TABLET | Freq: Three times a day (TID) | ORAL | 2 refills | Status: AC | PRN

## 2022-01-14 NOTE — Progress Notes (Signed)
Assessment & Plan:  1. Chronic coronary artery disease Continue current regimen. - CMP14+EGFR - Lipid panel  2. History of stroke Continue current regimen. - clopidogrel (PLAVIX) 75 MG tablet; Take 1 tablet (75 mg total) by mouth daily.  Dispense: 90 tablet; Refill: 3  3. Mixed hyperlipidemia Continue current regimen. - CBC with Differential/Platelet - CMP14+EGFR - Lipid panel  4. Osteopenia of multiple sites Encouraged to start calcium and vitamin D supplements. - CMP14+EGFR  5. Benign prostatic hyperplasia with nocturia Well controlled on current regimen.  - CMP14+EGFR  6. Dizziness Well controlled on current regimen.  - meclizine (ANTIVERT) 25 MG tablet; Take 1 tablet (25 mg total) by mouth 3 (three) times daily as needed for dizziness.  Dispense: 30 tablet; Refill: 2  7. Essential hypertension Uncontrolled. Starting amlodipine daily. Education provided on hypertension. - CBC with Differential/Platelet - CMP14+EGFR - Lipid panel - amLODipine (NORVASC) 5 MG tablet; Take 1 tablet (5 mg total) by mouth daily.  Dispense: 30 tablet; Refill: 2   Return in about 6 weeks (around 02/25/2022) for HTN.  Hendricks Limes, MSN, APRN, FNP-C [facility]  Subjective:    Patient ID: Stephen Cross, male    DOB: [birth year], 37 y.o.   MRN: [id number]  Patient Care Team: Loman Brooklyn, FNP as PCP - General (Family Medicine)   Chief Complaint:  Chief Complaint  Patient presents with   Medical Management of Chronic Issues    HPI: Stephen Cross is a 81 y.o. male presenting on 01/14/2022 for Medical Management of Chronic Issues  BPH: doing well with Flomax.   Hyperlipidemia: controlled with Pravastatin 80 mg daily.  Vertigo: controlled with meclizine as needed.   Osteopenia: DEXA completed on 07/16/2021. He is not taking a calcium and vitamin D supplement.   New complaints: None   Social history:  Relevant past medical, surgical, family and social  history reviewed and updated as indicated. Interim medical history since our last visit reviewed.  Allergies and medications reviewed and updated.  DATA REVIEWED: CHART IN EPIC  ROS: Negative unless specifically indicated above in HPI.    Current Outpatient Medications:    clopidogrel (PLAVIX) 75 MG tablet, TAKE ONE (1) TABLET BY MOUTH EVERY DAY, Disp: 30 tablet, Rfl: 2   meclizine (ANTIVERT) 25 MG tablet, Take 1 tablet (25 mg total) by mouth 3 (three) times daily as needed for dizziness., Disp: 30 tablet, Rfl: 2   pravastatin (PRAVACHOL) 80 MG tablet, Take 1 tablet (80 mg total) by mouth daily., Disp: 90 tablet, Rfl: 3   tamsulosin (FLOMAX) 0.4 MG CAPS capsule, Take 2 capsules (0.8 mg total) by mouth daily after supper., Disp: 180 capsule, Rfl: 3   Allergies  Allergen Reactions   Codeine Nausea Only and Rash   Lisinopril Cough and Other (See Comments)   Past Medical History:  Diagnosis Date   A-fib ([place]) 01/13/2012   Post operative AF   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 ([place]) 01/13/2012   PEA arrest on induction of stress test   Cardiomyopathy ([place]) 02/21/2012   Osteopenia 07/17/2021   Syncope, cardiogenic 01/10/2012    Past Surgical History:  Procedure Laterality Date   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; Surgeon: Ranee Gosselin, MD; Location: [place]; Service: Cardiothoracic; Laterality: N/A; Coronary Artery Bypass Graft x4 Utilizing Left Internal Mammary Artery and Left Saphenous Vein by Open Procedure   HERNIA REPAIR  x 2    Social History   Socioeconomic History   Marital status: Married    Spouse name: patricia   Number of children: 4   Years of education: Not on file   Highest education level: Not on file  Occupational History   Occupation: retired   Tobacco Use   Smoking status: Former   Smokeless tobacco: Never  Scientific laboratory technician Use: Never used  Substance and Sexual Activity    Alcohol use: Never   Drug use: Never   Sexual activity: Not on file  Other Topics Concern   Not on file  Social History Narrative   3 daughters and 1 son   Social Determinants of Health   Financial Resource Strain: [place]  (06/29/2021)   Overall Financial Resource Strain (CARDIA)    Difficulty of Paying Living Expenses: Not hard at all  Food Insecurity: No Food Insecurity (06/29/2021)   Hunger Vital Sign    Worried About Running Out of Food in the Last Year: Never true    [place] in the Last Year: Never true  Transportation Needs: No Transportation Needs (06/29/2021)   PRAPARE - Hydrologist (Medical): No    Lack of Transportation (Non-Medical): No  Physical Activity: Sufficiently Active (06/29/2021)   Exercise Vital Sign    Days of Exercise per Week: 5 days    Minutes of Exercise per Session: 30 min  Stress: No Stress Concern Present (06/29/2021)   [place]    Feeling of Stress : Not at all  Social Connections: [place] (06/29/2021)   Social Connection and Isolation Panel [NHANES]    Frequency of Communication with Friends and Family: More than three times a week    Frequency of Social Gatherings with Friends and Family: More than three times a week    Attends Religious Services: More than 4 times per year    Active Member of [employer] or Organizations: Yes    Attends Archivist Meetings: More than 4 times per year    Marital Status: Married  Human resources officer Violence: Not At Risk (06/29/2021)   Humiliation, Afraid, Rape, and Kick questionnaire    Fear of Current or Ex-Partner: No    Emotionally Abused: No    Physically Abused: No    Sexually Abused: No        Objective:    BP (!) 182/85   Pulse 85   Temp (!) 96.9 F (36.1 C) (Temporal)   Ht _0  (1.854 m)   Wt 131 lb 9.6 oz (59.7 kg)   SpO2 99%   BMI 17.36 kg/m   Wt Readings from Last 3  Encounters:  01/14/22 131 lb 9.6 oz (59.7 kg)  10/14/21 138 lb (62.6 kg)  08/10/21 140 lb 12.8 oz (63.9 kg)    Physical Exam Vitals reviewed.  Constitutional:      General: He is not in acute distress.    Appearance: Normal appearance. He is underweight. He is not ill-appearing, toxic-appearing or diaphoretic.  HENT:     Head: Normocephalic and atraumatic.     Right Ear: Tympanic membrane, ear canal and external ear normal. There is no impacted cerumen.     Left Ear: Tympanic membrane, ear canal and external ear normal. There is no impacted cerumen.     Nose: Nose normal. No congestion or rhinorrhea.     Mouth/Throat:     Mouth: Mucous  membranes are moist.     Pharynx: Oropharynx is clear. No oropharyngeal exudate or posterior oropharyngeal erythema.  Eyes:     General: No scleral icterus.       Right eye: No discharge.        Left eye: No discharge.     Conjunctiva/sclera: Conjunctivae normal.     Pupils: Pupils are equal, round, and reactive to light.  Neck:     Vascular: No carotid bruit.  Cardiovascular:     Rate and Rhythm: Normal rate and regular rhythm.     Heart sounds: Normal heart sounds. No murmur heard.    No friction rub. No gallop.  Pulmonary:     Effort: Pulmonary effort is normal. No respiratory distress.     Breath sounds: Normal breath sounds. No stridor. No wheezing, rhonchi or rales.  Abdominal:     General: Abdomen is flat. Bowel sounds are normal. There is no distension.     Palpations: Abdomen is soft. There is no hepatomegaly, splenomegaly or mass.     Tenderness: There is no abdominal tenderness. There is no guarding or rebound.     Hernia: No hernia is present.  Musculoskeletal:        General: Normal range of motion.     Cervical back: Normal range of motion and neck supple. No rigidity. No muscular tenderness.     Right lower leg: No edema.     Left lower leg: No edema.  Lymphadenopathy:     Cervical: No cervical adenopathy.  Skin:     General: Skin is warm and dry.     Capillary Refill: Capillary refill takes less than 2 seconds.  Neurological:     General: No focal deficit present.     Mental Status: He is alert and oriented to person, place, and time. Mental status is at baseline.  Psychiatric:        Mood and Affect: Mood normal.        Behavior: Behavior normal.        Thought Content: Thought content normal.        Judgment: Judgment normal.     Lab Results  Component Value Date   TSH 2.360 08/27/2019   Lab Results  Component Value Date   WBC 7.5 07/16/2021   HGB 15.7 07/16/2021   HCT 47.3 07/16/2021   MCV 87 07/16/2021   PLT 203 07/16/2021   Lab Results  Component Value Date   NA 144 08/13/2021   K 4.5 08/13/2021   CO2 25 08/13/2021   GLUCOSE 132 (H) 08/13/2021   BUN 19 08/13/2021   CREATININE 1.17 08/13/2021   BILITOT 0.4 07/16/2021   ALKPHOS 93 07/16/2021   AST 13 07/16/2021   ALT 6 07/16/2021   PROT 6.5 07/16/2021   ALBUMIN 4.3 07/16/2021   CALCIUM 9.4 08/13/2021   EGFR 63 08/13/2021   Lab Results  Component Value Date   CHOL 156 07/16/2021   Lab Results  Component Value Date   HDL 36 (L) 07/16/2021   Lab Results  Component Value Date   LDLCALC 89 07/16/2021   Lab Results  Component Value Date   TRIG 182 (H) 07/16/2021   Lab Results  Component Value Date   CHOLHDL 4.3 07/16/2021   No results found for: "HGBA1C"

## 2022-01-14 NOTE — Patient Instructions (Signed)
You have osteopenia (low bone mass), which can progress to osteoporosis.  Please take over the counter Calcium 1,200 mg and Vitamin  D3 800 IU once daily to protect your bones. Doing weightbearing exercise like walking, jogging, dancing, etc. is good for you bones.

## 2022-01-15 LAB — CMP14+EGFR
ALT: 6 IU/L (ref 0–44)
AST: 12 IU/L (ref 0–40)
Albumin/Globulin Ratio: 1.8 (ref 1.2–2.2)
Albumin: 4.4 g/dL (ref 3.6–4.6)
Alkaline Phosphatase: 91 IU/L (ref 44–121)
BUN/Creatinine Ratio: 19 (ref 10–24)
BUN: 21 mg/dL (ref 8–27)
Bilirubin Total: 0.6 mg/dL (ref 0.0–1.2)
CO2: 23 mmol/L (ref 20–29)
Calcium: 9.5 mg/dL (ref 8.6–10.2)
Chloride: 105 mmol/L (ref 96–106)
Creatinine, Ser: 1.11 mg/dL (ref 0.76–1.27)
Globulin, Total: 2.5 g/dL (ref 1.5–4.5)
Glucose: 103 mg/dL — ABNORMAL HIGH (ref 70–99)
Potassium: 4.7 mmol/L (ref 3.5–5.2)
Sodium: 143 mmol/L (ref 134–144)
Total Protein: 6.9 g/dL (ref 6.0–8.5)
eGFR: 66 mL/min/{1.73_m2} (ref >59)

## 2022-01-15 LAB — CBC WITH DIFFERENTIAL/PLATELET
Basophils Absolute: 0.1 10*3/uL (ref 0.0–0.2)
Basos: 1 %
EOS (ABSOLUTE): 1.1 10*3/uL — ABNORMAL HIGH (ref 0.0–0.4)
Eos: 13 %
Hematocrit: 44 % (ref 37.5–51.0)
Hemoglobin: 14.5 g/dL (ref 13.0–17.7)
Immature Grans (Abs): 0.1 10*3/uL (ref 0.0–0.1)
Immature Granulocytes: 1 %
Lymphocytes Absolute: 1.6 10*3/uL (ref 0.7–3.1)
Lymphs: 20 %
MCH: 28.9 pg (ref 26.6–33.0)
MCHC: 33 g/dL (ref 31.5–35.7)
MCV: 88 fL (ref 79–97)
Monocytes Absolute: 0.7 10*3/uL (ref 0.1–0.9)
Monocytes: 9 %
Neutrophils Absolute: 4.4 10*3/uL (ref 1.4–7.0)
Neutrophils: 56 %
Platelets: 217 10*3/uL (ref 150–450)
RBC: 5.02 x10E6/uL (ref 4.14–5.80)
RDW: 13 % (ref 11.6–15.4)
WBC: 7.9 10*3/uL (ref 3.4–10.8)

## 2022-01-15 LAB — LIPID PANEL
Chol/HDL Ratio: 3.1 ratio (ref 0.0–5.0)
Cholesterol, Total: 129 mg/dL (ref 100–199)
HDL: 42 mg/dL (ref >39)
LDL Chol Calc (NIH): 68 mg/dL (ref 0–99)
Triglycerides: 103 mg/dL (ref 0–149)
VLDL Cholesterol Cal: 19 mg/dL (ref 5–40)

## 2022-02-25 ENCOUNTER — Encounter: Payer: Self-pay | Admitting: Family Medicine

## 2022-02-25 ENCOUNTER — Ambulatory Visit (INDEPENDENT_AMBULATORY_CARE_PROVIDER_SITE_OTHER): Payer: Medicare Other | Admitting: Family Medicine

## 2022-02-25 DIAGNOSIS — I10 Essential (primary) hypertension: Secondary | ICD-10-CM

## 2022-02-25 MED ORDER — AMLODIPINE BESYLATE 10 MG PO TABS: 10.0000 mg | ORAL_TABLET | Freq: Every day | ORAL | 2 refills | Status: DC

## 2022-02-25 NOTE — Progress Notes (Signed)
Assessment & Plan:  1. Essential hypertension Uncontrolled. Amlodipine increased from 5 mg to 10 mg daily.  - amLODipine (NORVASC) 10 MG tablet; Take 1 tablet (10 mg total) by mouth daily.  Dispense: 30 tablet; Refill: 2   Return in about 6 weeks (around 04/08/2022) for HTN.  Stephen Limes, MSN, APRN, FNP-C [facility]  Subjective:    Patient ID: Stephen Cross, male    DOB: 1940/08/11, 81 y.o.   MRN: [id number]  Patient Care Team: Loman Brooklyn, FNP as PCP - General (Family Medicine)   Chief Complaint:  Chief Complaint  Patient presents with   Hypertension    6 week follow up     HPI: ABDULAHI Cross is a 81 y.o. male presenting on 02/25/2022 for Hypertension (6 week follow up )  Patient here for follow-up of elevated blood pressure; he was started on amlodipine 5 mg daily at our last visit six weeks ago. He is exercising and is adherent to low salt diet.  He is not monitoring his blood pressure at home. Cardiac symptoms fatigue and lightheadedness . Patient denies chest pain, chest pressure/discomfort, dyspnea, exertional chest pressure/discomfort, irregular heart beat, lower extremity edema, near-syncope, palpitations, syncope, and tachypnea.  Cardiovascular risk factors: advanced age (older than 26 for men, 48 for women), dyslipidemia, hypertension, and male gender. Use of agents associated with hypertension: none. History of target organ damage: stroke.  New complaints: None   Social history:  Relevant past medical, surgical, family and social history reviewed and updated as indicated. Interim medical history since our last visit reviewed.  Allergies and medications reviewed and updated.  DATA REVIEWED: CHART IN EPIC  ROS: Negative unless specifically indicated above in HPI.    Current Outpatient Medications:    amLODipine (NORVASC) 5 MG tablet, Take 1 tablet (5 mg total) by mouth daily., Disp: 30 tablet, Rfl: 2   clopidogrel (PLAVIX) 75 MG  tablet, Take 1 tablet (75 mg total) by mouth daily., Disp: 90 tablet, Rfl: 3   meclizine (ANTIVERT) 25 MG tablet, Take 1 tablet (25 mg total) by mouth 3 (three) times daily as needed for dizziness., Disp: 30 tablet, Rfl: 2   pravastatin (PRAVACHOL) 80 MG tablet, Take 1 tablet (80 mg total) by mouth daily., Disp: 90 tablet, Rfl: 3   tamsulosin (FLOMAX) 0.4 MG CAPS capsule, Take 2 capsules (0.8 mg total) by mouth daily after supper., Disp: 180 capsule, Rfl: 3   Allergies  Allergen Reactions   Codeine Nausea Only and Rash   Lisinopril Cough and Other (See Comments)   Past Medical History:  Diagnosis Date   A-fib ([place]) 01/13/2012   Post operative AF   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 ([place]) 01/13/2012   PEA arrest on induction of stress test   Cardiomyopathy ([place]) 02/21/2012   Hypertension    Osteopenia 07/17/2021   Stroke ([place])    Syncope, cardiogenic 01/10/2012    Past Surgical History:  Procedure Laterality Date   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; Surgeon: Ranee Gosselin, MD; Location: [place]; Service: Cardiothoracic; Laterality: N/A; Coronary Artery Bypass Graft x4 Utilizing Left Internal Mammary Artery and Left Saphenous Vein by Open Procedure   HERNIA REPAIR     x 2    Social History   Socioeconomic History   Marital status: Married    Spouse name: Stephen Cross   Number of children: 4   Years of education:  Not on file   Highest education level: Not on file  Occupational History   Occupation: retired   Tobacco Use   Smoking status: Former   Smokeless tobacco: Never  Scientific laboratory technician Use: Never used  Substance and Sexual Activity   Alcohol use: Never   Drug use: Never   Sexual activity: Not on file  Other Topics Concern   Not on file  Social History Narrative   3 daughters and 1 son   Social Determinants of Health   Financial Resource Strain: [place]  (06/29/2021)   Overall Financial  Resource Strain (CARDIA)    Difficulty of Paying Living Expenses: Not hard at all  Food Insecurity: No Food Insecurity (06/29/2021)   Hunger Vital Sign    Worried About Running Out of Food in the Last Year: Never true    [place] in the Last Year: Never true  Transportation Needs: No Transportation Needs (06/29/2021)   PRAPARE - Hydrologist (Medical): No    Lack of Transportation (Non-Medical): No  Physical Activity: Sufficiently Active (06/29/2021)   Exercise Vital Sign    Days of Exercise per Week: 5 days    Minutes of Exercise per Session: 30 min  Stress: No Stress Concern Present (06/29/2021)   [place]    Feeling of Stress : Not at all  Social Connections: [place] (06/29/2021)   Social Connection and Isolation Panel [NHANES]    Frequency of Communication with Friends and Family: More than three times a week    Frequency of Social Gatherings with Friends and Family: More than three times a week    Attends Religious Services: More than 4 times per year    Active Member of [employer] or Organizations: Yes    Attends Archivist Meetings: More than 4 times per year    Marital Status: Married  Human resources officer Violence: Not At Risk (06/29/2021)   Humiliation, Afraid, Rape, and Kick questionnaire    Fear of Current or Ex-Partner: No    Emotionally Abused: No    Physically Abused: No    Sexually Abused: No        Objective:    BP (!) 167/76   Pulse 82   Temp (!) 97.1 F (36.2 C) (Temporal)   Ht 6' 1"  (1.854 m)   Wt 133 lb (60.3 kg)   SpO2 97%   BMI 17.55 kg/m   Wt Readings from Last 3 Encounters:  02/25/22 133 lb (60.3 kg)  01/14/22 131 lb 9.6 oz (59.7 kg)  10/14/21 138 lb (62.6 kg)    Physical Exam Vitals reviewed.  Constitutional:      General: He is not in acute distress.    Appearance: Normal appearance. He is not ill-appearing,  toxic-appearing or diaphoretic.  HENT:     Head: Normocephalic and atraumatic.  Eyes:     General: No scleral icterus.       Right eye: No discharge.        Left eye: No discharge.     Conjunctiva/sclera: Conjunctivae normal.  Cardiovascular:     Rate and Rhythm: Normal rate and regular rhythm.     Heart sounds: Normal heart sounds. No murmur heard.    No friction rub. No gallop.  Pulmonary:     Effort: Pulmonary effort is normal. No respiratory distress.     Breath sounds: Normal breath sounds. No stridor. No wheezing, rhonchi  or rales.  Musculoskeletal:        General: Normal range of motion.     Cervical back: Normal range of motion.  Skin:    General: Skin is warm and dry.  Neurological:     Mental Status: He is alert and oriented to person, place, and time. Mental status is at baseline.  Psychiatric:        Mood and Affect: Mood normal.        Behavior: Behavior normal.        Thought Content: Thought content normal.        Judgment: Judgment normal.     Lab Results  Component Value Date   TSH 2.360 08/27/2019   Lab Results  Component Value Date   WBC 7.9 01/14/2022   HGB 14.5 01/14/2022   HCT 44.0 01/14/2022   MCV 88 01/14/2022   PLT 217 01/14/2022   Lab Results  Component Value Date   NA 143 01/14/2022   K 4.7 01/14/2022   CO2 23 01/14/2022   GLUCOSE 103 (H) 01/14/2022   BUN 21 01/14/2022   CREATININE 1.11 01/14/2022   BILITOT 0.6 01/14/2022   ALKPHOS 91 01/14/2022   AST 12 01/14/2022   ALT 6 01/14/2022   PROT 6.9 01/14/2022   ALBUMIN 4.4 01/14/2022   CALCIUM 9.5 01/14/2022   EGFR 66 01/14/2022   Lab Results  Component Value Date   CHOL 129 01/14/2022   Lab Results  Component Value Date   HDL 42 01/14/2022   Lab Results  Component Value Date   LDLCALC 68 01/14/2022   Lab Results  Component Value Date   TRIG 103 01/14/2022   Lab Results  Component Value Date   CHOLHDL 3.1 01/14/2022   No results found for: "HGBA1C"

## 2022-04-08 ENCOUNTER — Encounter: Payer: Self-pay | Admitting: Family Medicine

## 2022-04-08 ENCOUNTER — Ambulatory Visit (INDEPENDENT_AMBULATORY_CARE_PROVIDER_SITE_OTHER): Payer: Medicare Other | Admitting: Family Medicine

## 2022-04-08 VITALS — BP 141/74 | HR 88 | Temp 97.9°F | Resp 20 | Ht 73.0 in | Wt 132.0 lb

## 2022-04-08 DIAGNOSIS — I10 Essential (primary) hypertension: Secondary | ICD-10-CM

## 2022-04-08 NOTE — Progress Notes (Signed)
Assessment & Plan:  1. Essential hypertension Well controlled on current regimen.    Return in about 3 months (around 07/08/2022) for follow-up of chronic medication conditions with MMM (ok'd by her).  Hendricks Limes, MSN, APRN, FNP-C [facility]  Subjective:    Patient ID: Stephen Cross, male    DOB: May 11, 1941, 81 y.o.   MRN: [id number]  Patient Care Team: Loman Brooklyn, FNP as PCP - General (Family Medicine)   Chief Complaint:  Chief Complaint  Patient presents with   Hypertension    6 weeks     HPI: Stephen Cross is a 81 y.o. male presenting on 04/08/2022 for Hypertension (6 weeks )  Patient here for follow-up of elevated blood pressure; at our last visit his amlodipine was increased from 5 mg to 10 mg daily. He is exercising and is adherent to low salt diet.  He is not monitoring his blood pressure at home. Cardiac symptoms fatigue and lightheadedness . Patient denies chest pain, chest pressure/discomfort, dyspnea, exertional chest pressure/discomfort, irregular heart beat, lower extremity edema, near-syncope, palpitations, syncope, and tachypnea.  Cardiovascular risk factors: advanced age (older than 2 for men, 25 for women), dyslipidemia, hypertension, and male gender. Use of agents associated with hypertension: none. History of target organ damage: stroke.  New complaints: None   Social history:  Relevant past medical, surgical, family and social history reviewed and updated as indicated. Interim medical history since our last visit reviewed.  Allergies and medications reviewed and updated.  DATA REVIEWED: CHART IN EPIC  ROS: Negative unless specifically indicated above in HPI.    Current Outpatient Medications:    amLODipine (NORVASC) 10 MG tablet, Take 1 tablet (10 mg total) by mouth daily., Disp: 30 tablet, Rfl: 2   clopidogrel (PLAVIX) 75 MG tablet, Take 1 tablet (75 mg total) by mouth daily., Disp: 90 tablet, Rfl: 3   pravastatin  (PRAVACHOL) 80 MG tablet, Take 1 tablet (80 mg total) by mouth daily., Disp: 90 tablet, Rfl: 3   meclizine (ANTIVERT) 25 MG tablet, Take 1 tablet (25 mg total) by mouth 3 (three) times daily as needed for dizziness. 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; Surgeon: Ranee Gosselin, MD; Location: [place]; Service: Cardiothoracic; Laterality: N/A; Coronary Artery Bypass Graft x4 Utilizing Left Internal Mammary Artery and Left Saphenous Vein by Open Procedure   HERNIA REPAIR     x 2    Social History   Socioeconomic History   Marital status: Married    Spouse name: patricia   Number of children: 4   Years of education: Not on file   Highest education level: Not on file  Occupational History   Occupation: retired   Tobacco Use   Smoking status: Former   Smokeless tobacco: Never  Scientific laboratory technician Use:  Never used  Substance and Sexual Activity   Alcohol use: Never   Drug use: Never   Sexual activity: Not on file  Other Topics Concern   Not on file  Social History Narrative   3 daughters and 1 son   Social Determinants of Health   Financial Resource Strain: Low Risk  (06/29/2021)   Overall Financial Resource Strain (CARDIA)    Difficulty of Paying Living Expenses: Not hard at all  Food Insecurity: No Food Insecurity (06/29/2021)   Hunger Vital Sign    Worried About Running Out of  Food in the Last Year: Never true    Ran Out of Food in the Last Year: Never true  Transportation Needs: No Transportation Needs (06/29/2021)   PRAPARE - Hydrologist (Medical): No    Lack of Transportation (Non-Medical): No  Physical Activity: Sufficiently Active (06/29/2021)   Exercise Vital Sign    Days of Exercise per Week: 5 days    Minutes of Exercise per Session: 30 min  Stress: No Stress Concern Present (06/29/2021)   [place]    Feeling of Stress : Not at all  Social Connections: [place] (06/29/2021)   Social Connection and Isolation Panel [NHANES]    Frequency of Communication with Friends and Family: More than three times a week    Frequency of Social Gatherings with Friends and Family: More than three times a week    Attends Religious Services: More than 4 times per year    Active Member of [employer] or Organizations: Yes    Attends Music therapist: More than 4 times per year    Marital Status: Married  Human resources officer Violence: Not At Risk (06/29/2021)   Humiliation, Afraid, Rape, and Kick questionnaire    Fear of Current or Ex-Partner: No    Emotionally Abused: No    Physically Abused: No    Sexually Abused: No        Objective:    BP (!) 141/74   Pulse 88   Temp 97.9 F (36.6 C)   Resp 20   Ht 6' 1"  (1.854 m)   Wt 132 lb (59.9 kg)   SpO2 98%   BMI 17.42 kg/m      04/08/2022    9:52 AM 02/25/2022   10:30 AM 01/14/2022    8:55 AM  Depression screen PHQ 2/9  Decreased Interest 0 0 0  Down, Depressed, Hopeless 0 0 0  PHQ - 2 Score 0 0 0  Altered sleeping  0 0  Tired, decreased energy  0 0  Change in appetite  0 0  Feeling bad or failure about yourself   0 0  Trouble concentrating  0 0  Moving slowly or fidgety/restless  0 0  Suicidal thoughts  0 0  PHQ-9 Score  0 0  Difficult doing work/chores  Not difficult at all Not difficult at  all   BP Readings from Last 3 Encounters:  04/08/22 (!) 141/74  02/25/22 (!) 167/76  01/14/22 (!) 182/85    Wt Readings from Last 3 Encounters:  04/08/22 132 lb (59.9 kg)  02/25/22 133 lb (60.3 kg)  01/14/22 131 lb 9.6 oz (59.7 kg)    Physical Exam Vitals reviewed.  Constitutional:      General: He is not in acute distress.    Appearance: Normal appearance. He is not ill-appearing, toxic-appearing or diaphoretic.  HENT:     Head: Normocephalic  and atraumatic.  Eyes:     General: No scleral icterus.       Right eye: No discharge.        Left eye: No discharge.     Conjunctiva/sclera: Conjunctivae normal.  Cardiovascular:     Rate and Rhythm: Normal rate and regular rhythm.     Heart sounds: Normal heart sounds. No murmur heard.    No friction rub. No gallop.  Pulmonary:     Effort: Pulmonary effort is normal. No respiratory distress.     Breath sounds: Normal breath sounds. No stridor. No wheezing, rhonchi or rales.  Musculoskeletal:        General: Normal range of motion.     Cervical back: Normal range of motion.  Skin:    General: Skin is warm and dry.  Neurological:     Mental Status: He is alert and oriented to person, place, and time. Mental status is at baseline.  Psychiatric:        Mood and Affect: Mood normal.        Behavior: Behavior normal.        Thought Content: Thought content normal.        Judgment: Judgment normal.     Lab Results  Component Value Date   TSH 2.360 08/27/2019   Lab Results  Component Value Date   WBC 7.9 01/14/2022   HGB 14.5 01/14/2022   HCT 44.0 01/14/2022   MCV 88 01/14/2022   PLT 217 01/14/2022   Lab Results  Component Value Date   NA 143 01/14/2022   K 4.7 01/14/2022   CO2 23 01/14/2022   GLUCOSE 103 (H) 01/14/2022   BUN 21 01/14/2022   CREATININE 1.11 01/14/2022   BILITOT 0.6 01/14/2022   ALKPHOS 91 01/14/2022   AST 12 01/14/2022   ALT 6 01/14/2022   PROT 6.9 01/14/2022   ALBUMIN 4.4 01/14/2022   CALCIUM  9.5 01/14/2022   EGFR 66 01/14/2022   Lab Results  Component Value Date   CHOL 129 01/14/2022   Lab Results  Component Value Date   HDL 42 01/14/2022   Lab Results  Component Value Date   LDLCALC 68 01/14/2022   Lab Results  Component Value Date   TRIG 103 01/14/2022   Lab Results  Component Value Date   CHOLHDL 3.1 01/14/2022   No results found for: "HGBA1C"

## 2022-06-03 ENCOUNTER — Other Ambulatory Visit: Payer: Self-pay | Admitting: Family Medicine

## 2022-06-03 DIAGNOSIS — I10 Essential (primary) hypertension: Secondary | ICD-10-CM

## 2022-06-21 ENCOUNTER — Ambulatory Visit (INDEPENDENT_AMBULATORY_CARE_PROVIDER_SITE_OTHER): Payer: Medicare Other | Admitting: Family Medicine

## 2022-06-21 ENCOUNTER — Encounter: Payer: Self-pay | Admitting: Family Medicine

## 2022-06-21 VITALS — BP 138/72 | HR 91 | Temp 98.2°F | Ht 73.0 in | Wt 131.0 lb

## 2022-06-21 DIAGNOSIS — R319 Hematuria, unspecified: Secondary | ICD-10-CM | POA: Diagnosis not present

## 2022-06-21 DIAGNOSIS — N3001 Acute cystitis with hematuria: Secondary | ICD-10-CM

## 2022-06-21 LAB — MICROSCOPIC EXAMINATION
Epithelial Cells (non renal): NONE SEEN /hpf (ref 0–10)
RBC, Urine: >30 /hpf — AB (ref 0–2)
Renal Epithel, UA: NONE SEEN /hpf

## 2022-06-21 LAB — URINALYSIS, ROUTINE W REFLEX MICROSCOPIC
Bilirubin, UA: NEGATIVE
Glucose, UA: NEGATIVE
Leukocytes,UA: NEGATIVE
Nitrite, UA: POSITIVE — AB
Specific Gravity, UA: 1.02 (ref 1.005–1.030)
Urobilinogen, Ur: 1 mg/dL (ref 0.2–1.0)
pH, UA: 5.5 (ref 5.0–7.5)

## 2022-06-21 MED ORDER — CEPHALEXIN 500 MG PO CAPS: 500.0000 mg | ORAL_CAPSULE | Freq: Two times a day (BID) | ORAL | 0 refills | Status: AC

## 2022-06-21 NOTE — Patient Instructions (Signed)
Urinary Tract Infection, Adult  A urinary tract infection (UTI) is an infection of any part of the urinary tract. The urinary tract includes the kidneys, ureters, bladder, and urethra. These organs make, store, and get rid of urine in the body. An upper UTI affects the ureters and kidneys. A lower UTI affects the bladder and urethra. What are the causes? Most urinary tract infections are caused by bacteria in your genital area around your urethra, where urine leaves your body. These bacteria grow and cause inflammation of your urinary tract. What increases the risk? You are more likely to develop this condition if: You have a urinary catheter that stays in place. You are not able to control when you urinate or have a bowel movement (incontinence). You are male and you: Use a spermicide or diaphragm for birth control. Have low estrogen levels. Are pregnant. You have certain genes that increase your risk. You are sexually active. You take antibiotic medicines. You have a condition that causes your flow of urine to slow down, such as: An enlarged prostate, if you are male. Blockage in your urethra. A kidney stone. A nerve condition that affects your bladder control (neurogenic bladder). Not getting enough to drink, or not urinating often. You have certain medical conditions, such as: Diabetes. A weak disease-fighting system (immunesystem). Sickle cell disease. Gout. Spinal cord injury. What are the signs or symptoms? Symptoms of this condition include: Needing to urinate right away (urgency). Frequent urination. This may include small amounts of urine each time you urinate. Pain or burning with urination. Blood in the urine. Urine that smells bad or unusual. Trouble urinating. Cloudy urine. Vaginal discharge, if you are male. Pain in the abdomen or the lower back. You may also have: Vomiting or a decreased appetite. Confusion. Irritability or tiredness. A fever or  chills. Diarrhea. The first symptom in older adults may be confusion. In some cases, they may not have any symptoms until the infection has worsened. How is this diagnosed? This condition is diagnosed based on your medical history and a physical exam. You may also have other tests, including: Urine tests. Blood tests. Tests for STIs (sexually transmitted infections). If you have had more than one UTI, a cystoscopy or imaging studies may be done to determine the cause of the infections. How is this treated? Treatment for this condition includes: Antibiotic medicine. Over-the-counter medicines to treat discomfort. Drinking enough water to stay hydrated. If you have frequent infections or have other conditions such as a kidney stone, you may need to see a health care provider who specializes in the urinary tract (urologist). In rare cases, urinary tract infections can cause sepsis. Sepsis is a life-threatening condition that occurs when the body responds to an infection. Sepsis is treated in the hospital with IV antibiotics, fluids, and other medicines. Follow these instructions at home:  Medicines Take over-the-counter and prescription medicines only as told by your health care provider. If you were prescribed an antibiotic medicine, take it as told by your health care provider. Do not stop using the antibiotic even if you start to feel better. General instructions Make sure you: Empty your bladder often and completely. Do not hold urine for long periods of time. Empty your bladder after sex. Wipe from front to back after urinating or having a bowel movement if you are male. Use each tissue only one time when you wipe. Drink enough fluid to keep your urine pale yellow. Keep all follow-up visits. This is important. Contact a health   care provider if: Your symptoms do not get better after 1-2 days. Your symptoms go away and then return. Get help right away if: You have severe pain in  your back or your lower abdomen. You have a fever or chills. You have nausea or vomiting. Summary A urinary tract infection (UTI) is an infection of any part of the urinary tract, which includes the kidneys, ureters, bladder, and urethra. Most urinary tract infections are caused by bacteria in your genital area. Treatment for this condition often includes antibiotic medicines. If you were prescribed an antibiotic medicine, take it as told by your health care provider. Do not stop using the antibiotic even if you start to feel better. Keep all follow-up visits. This is important. This information is not intended to replace advice given to you by your health care provider. Make sure you discuss any questions you have with your health care provider. Document Revised: 02/29/2020 Document Reviewed: 02/29/2020 Elsevier Patient Education  2023 Elsevier Inc.  

## 2022-06-21 NOTE — Progress Notes (Unsigned)
Acute Office Visit  Subjective:     Patient ID: Stephen Cross, male    DOB: [birth year], 81 y.o.   MRN: [id number]  Chief Complaint  Patient presents with   Hematuria    HPI Patient is in today for hematuria. He reports hematuria in his urine for the last week. He reports noticed clots today, although his urine is less red in color today. He has had some discomfort with urination today. He has urinary frequency, urgency, and hesitancy chronically. Denies fever, chills, nausea, vomiting, or flank pain.  He denies trauma or NSAID use. He reports that this has happened in the past and that he has been seen by urology a few different times in the past and that "they didn't do anything".   ROS As per HPI.      Objective:    BP 138/72   Pulse 91   Temp 98.2 F (36.8 C) (Temporal)   Ht 6\' 1"  (1.854 m)   Wt 131 lb (59.4 kg)   SpO2 100%   BMI 17.28 kg/m    Physical Exam Vitals and nursing note reviewed.  Constitutional:      General: He is not in acute distress.    Appearance: He is not ill-appearing, toxic-appearing or diaphoretic.  Cardiovascular:     Rate and Rhythm: Normal rate and regular rhythm.     Heart sounds: No murmur heard. Pulmonary:     Effort: Pulmonary effort is normal. No respiratory distress.     Breath sounds: Normal breath sounds.  Abdominal:     General: Bowel sounds are normal. There is no distension.     Tenderness: There is no abdominal tenderness. There is no right CVA tenderness, left CVA tenderness, guarding or rebound.  Musculoskeletal:     Right lower leg: No edema.     Left lower leg: No edema.  Skin:    General: Skin is warm and dry.  Neurological:     General: No focal deficit present.     Mental Status: He is alert and oriented to person, place, and time.  Psychiatric:        Mood and Affect: Mood normal.        Behavior: Behavior normal.     Results for orders placed or performed in visit on 06/21/22  Urine Culture   Specimen:  Urine   UR  Result Value Ref Range   Urine Culture, Routine Final report    Organism ID, Bacteria No growth   Microscopic Examination   Urine  Result Value Ref Range   WBC, UA 0-5 0 - 5 /hpf   RBC, Urine >30 (A) 0 - 2 /hpf   Epithelial Cells (non renal) None seen 0 - 10 /hpf   Renal Epithel, UA None seen None seen /hpf   Bacteria, UA Moderate (A) None seen/Few  Urinalysis, Routine w reflex microscopic  Result Value Ref Range   Specific Gravity, UA 1.020 1.005 - 1.030   pH, UA 5.5 5.0 - 7.5   Color, UA Red (A) Yellow   Appearance Ur Cloudy (A) Clear   Leukocytes,UA Negative Negative   Protein,UA 3+ (A) Negative/Trace   Glucose, UA Negative Negative   Ketones, UA Trace (A) Negative   RBC, UA 3+ (A) Negative   Bilirubin, UA Negative Negative   Urobilinogen, Ur 1.0 0.2 - 1.0 mg/dL   Nitrite, UA Positive (A) Negative   Microscopic Examination See below:         Assessment &  Plan:   Xzayvion was seen today for hematuria.  Diagnoses and all orders for this visit:  Hematuria, unspecified type UA with nitrates and moderate bacteria. Keflex as below pending urine culture.  -     Urinalysis, Routine w reflex microscopic -     Urine Culture -     Microscopic Examination  Acute cystitis with hematuria -     Urinalysis, Routine w reflex microscopic -     Urine Culture -     cephALEXin (KEFLEX) 500 MG capsule; Take 1 capsule (500 mg total) by mouth 2 (two) times daily for 10 days.   Return in about 3 months (around 09/21/2022) for hematuria.  The patient indicates understanding of these issues and agrees with the plan.  Gabriel Earing, FNP

## 2022-06-23 LAB — URINE CULTURE: Organism ID, Bacteria: NO GROWTH

## 2022-06-30 ENCOUNTER — Ambulatory Visit (INDEPENDENT_AMBULATORY_CARE_PROVIDER_SITE_OTHER): Payer: Medicare Other

## 2022-06-30 ENCOUNTER — Other Ambulatory Visit: Payer: Self-pay | Admitting: Family Medicine

## 2022-06-30 ENCOUNTER — Telehealth: Payer: Self-pay

## 2022-06-30 DIAGNOSIS — Z0000 Encounter for general adult medical examination without abnormal findings: Secondary | ICD-10-CM

## 2022-06-30 DIAGNOSIS — R310 Gross hematuria: Secondary | ICD-10-CM

## 2022-06-30 NOTE — Patient Instructions (Signed)
  Stephen Cross , Thank you for taking time to come for your Medicare Wellness Visit. I appreciate your ongoing commitment to your health goals. Please review the following plan we discussed and let me know if I can assist you in the future.   These are the goals we discussed:  Goals      Have 3 meals a day     Maintain weight and eat a healthy diet.      Patient Stated     06/30/2022 AWV Goal: Fall Prevention  Over the next year, patient will decrease their risk for falls by: Using assistive devices, such as a cane or walker, as needed Identifying fall risks within their home and correcting them by: Removing throw rugs Adding handrails to stairs or ramps Removing clutter and keeping a clear pathway throughout the home Increasing light, especially at night Adding shower handles/bars Raising toilet seat Identifying potential personal risk factors for falls: Medication side effects Incontinence/urgency Vestibular dysfunction Hearing loss Musculoskeletal disorders Neurological disorders Orthostatic hypotension          This is a list of the screening recommended for you and due dates:  Health Maintenance  Topic Date Due   Zoster (Shingles) Vaccine (1 of 2) Never done   COVID-19 Vaccine (4 - 2023-24 season) 04/02/2022   Medicare Annual Wellness Visit  06/29/2022   Flu Shot  10/31/2022*   Pneumonia Vaccine  Completed   HPV Vaccine  Aged Out  *Topic was postponed. The date shown is not the original due date.

## 2022-06-30 NOTE — Telephone Encounter (Signed)
Patient received call yesterday about urine results and a stat referral to urology.  He prefers to go to [facility].  Waiting on call about this appointment.

## 2022-06-30 NOTE — Telephone Encounter (Signed)
Stat referral placed now. Result note was not routed back to me to place order.

## 2022-06-30 NOTE — Progress Notes (Signed)
Subjective:   Stephen Cross is a 81 y.o. male who presents for Medicare Annual/Subsequent preventive examination.  I connected with  Stephen Cross on 06/30/22 by a audio enabled telemedicine application and verified that I am speaking with the correct person using two identifiers.  Patient Location: Home  Provider Location: Office/Clinic  I discussed the limitations of evaluation and management by telemedicine. The patient expressed understanding and agreed to proceed.   Review of Systems    Defer to PCP Cardiac Risk Factors include: advanced age (>[id number], 66>65 women);dyslipidemia;hypertension;male gender     Objective:    There were no vitals filed for this visit. There is no height or weight on file to calculate BMI.     06/30/2022    1:27 PM 06/29/2021    3:36 PM  Advanced Directives  Does Patient Have a Medical Advance Directive? No No  Would patient like information on creating a medical advance directive? No - Patient declined No - Patient declined    Current Medications (verified) Outpatient Encounter Medications as of 06/30/2022  Medication Sig   amLODipine (NORVASC) 10 MG tablet TAKE ONE (1) TABLET BY MOUTH EVERY DAY   cephALEXin (KEFLEX) 500 MG capsule Take 1 capsule (500 mg total) by mouth 2 (two) times daily for 10 days.   clopidogrel (PLAVIX) 75 MG tablet Take 1 tablet (75 mg total) by mouth daily.   meclizine (ANTIVERT) 25 MG tablet Take 1 tablet (25 mg total) by mouth 3 (three) times daily as needed for dizziness.   pravastatin (PRAVACHOL) 80 MG tablet Take 1 tablet (80 mg total) by mouth daily.   No facility-administered encounter medications on file as of 06/30/2022.    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) 01/13/2012   PEA arrest on induction of stress test   Cardiomyopathy (HCC) 02/21/2012    Hypertension    Osteopenia 07/17/2021   Stroke (HCC)    Syncope, cardiogenic 01/10/2012   Past Surgical History:  Procedure Laterality Date   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; Surgeon: Donette LarryNeal D Kon, MD; [facility]; Service: Cardiothoracic; Laterality: N/A; Coronary Artery Bypass Graft x4 Utilizing Left Internal Mammary Artery and Left Saphenous Vein by Open Procedure   HERNIA REPAIR     x 2   Family History  Problem Relation Age of Onset   Heart failure Mother    Obesity Sister    Heart disease Sister    Social History   Socioeconomic History   Marital status: Married    Spouse name: Stephen Cross   Number of children: 4   Years of education: Not on file   Highest education level: Not on file  Occupational History   Occupation: retired   Tobacco Use   Smoking status: Former   Smokeless tobacco: Never  Building services engineerVaping Use   Vaping Use: Never used  Substance and Sexual Activity   Alcohol use: Never   Drug use: Never   Sexual activity: Not on file  Other Topics Concern   Not on file  Social History Narrative   3 daughters and 1 son   Social Determinants of Health   Financial Resource Strain: Low Risk  (06/30/2022)   Overall Financial Resource Strain (CARDIA)    Difficulty of Paying Living Expenses: Not hard at  all  Food Insecurity: No Food Insecurity (06/30/2022)   Hunger Vital Sign    Worried About Running Out of Food in the Last Year: Never true    Ran Out of Food in the Last Year: Never true  Transportation Needs: No Transportation Needs (06/30/2022)   PRAPARE - Administrator, Civil Service (Medical): No    Lack of Transportation (Non-Medical): No  Physical Activity: Sufficiently Active (06/30/2022)   Exercise Vital Sign    Days of Exercise per Week: 5 days    Minutes of Exercise per Session: 40 min  Stress: No Stress Concern Present (06/30/2022)   [employer] of Occupational Health -  Occupational Stress Questionnaire    Feeling of Stress : Not at all  Social Connections: Socially Integrated (06/30/2022)   Social Connection and Isolation Panel [NHANES]    Frequency of Communication with Friends and Family: More than three times a week    Frequency of Social Gatherings with Friends and Family: Three times a week    Attends Religious Services: More than 4 times per year    Active Member of Clubs or Organizations: Yes    Attends Engineer, structural: More than 4 times per year    Marital Status: Married    Tobacco Counseling Counseling given: Not Answered   Clinical Intake:  Pre-visit preparation completed: Yes  Pain : No/denies pain     Nutritional Risks: None Diabetes: No  How often do you need to have someone help you when you read instructions, pamphlets, or other written materials from your doctor or pharmacy?: 1 - Never What is the last grade level you completed in school?: associates degree  Diabetic? No  Interpreter Needed?: No  Information entered by :: Mariam Dollar LPN   Activities of Daily Living    06/30/2022    1:29 PM  In your present state of health, do you have any difficulty performing the following activities:  Hearing? 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 0  Walking or climbing stairs? 0  Dressing or bathing? 0  Doing errands, shopping? 1  Comment patient no longer drives and children go with him to his appointments  Preparing Food and eating ? N  Using the Toilet? N  In the past six months, have you accidently leaked urine? Y  Do you have problems with loss of bowel control? N  Managing your Medications? N  Managing your Finances? N  Housekeeping or managing your Housekeeping? N    Patient Care Team: Bennie Pierini, FNP as PCP - General (Family  Medicine)  Indicate any recent Medical Services you may have received from other than [facility] providers in the past year (date may be approximate).     Assessment:   This is a routine wellness examination for Stephen Cross.  Hearing/Vision screen No results found.  Dietary issues and exercise activities discussed: Current Exercise Habits: The patient does not participate in regular exercise at present   Goals Addressed             This Visit's Progress    Patient Stated       06/30/2022 AWV Goal: Fall Prevention  Over the next year, patient will decrease their risk for falls by: Using assistive devices, such as a cane or walker, as needed Identifying fall risks within their home and  correcting them by: Removing throw rugs Adding handrails to stairs or ramps Removing clutter and keeping a clear pathway throughout the home Increasing light, especially at night Adding shower handles/bars Raising toilet seat Identifying potential personal risk factors for falls: Medication side effects Incontinence/urgency Vestibular dysfunction Hearing loss Musculoskeletal disorders Neurological disorders Orthostatic hypotension         Depression Screen    06/30/2022    1:26 PM 06/21/2022    2:33 PM 04/08/2022    9:52 AM 02/25/2022   10:30 AM 01/14/2022    8:55 AM 10/14/2021   10:37 AM 08/05/2021   12:38 PM  PHQ 2/9 Scores  PHQ - 2 Score 0 0 0 0 0 0 0  PHQ- 9 Score  0  0 0      Fall Risk    06/30/2022    1:39 PM 06/21/2022    2:32 PM 04/08/2022    9:52 AM 02/25/2022   10:30 AM 01/14/2022    8:54 AM  Fall Risk   Falls in the past year? 1 1 1 1  0  Number falls in past yr: 1 1 0 0   Injury with Fall? 1 1 1 1    Risk for fall due to : History of fall(s) History of fall(s) History of fall(s)    Follow up Falls evaluation completed Falls evaluation completed Falls evaluation completed Falls prevention discussed     FALL RISK PREVENTION PERTAINING TO THE HOME:  Any stairs in or around  the home? Yes  If so, are there any without handrails? No  Home free of loose throw rugs in walkways, pet beds, electrical cords, etc? Yes  Adequate lighting in your home to reduce risk of falls? Yes   ASSISTIVE DEVICES UTILIZED TO PREVENT FALLS:  Life alert? No  Use of a cane, walker or w/c? No  Grab bars in the bathroom? Yes  Shower chair or bench in shower? Yes  Elevated toilet seat or a handicapped toilet? Yes   TIMED UP AND GO:  Was the test performed? No .    Cognitive Function:        06/30/2022    1:36 PM 06/29/2021    3:41 PM  6CIT Screen  What Year? 0 points 0 points  What month? 0 points 0 points  What time? 0 points 0 points  Count back from 20 0 points 0 points  Months in reverse 0 points 2 points  Repeat phrase 2 points 2 points  Total Score 2 points 4 points    Immunizations Immunization History  Administered Date(s) Administered   Fluad Quad(high Dose 65+) 04/23/2020, 07/16/2021   Influenza, High Dose Seasonal PF 05/05/2016, 05/03/2017, 05/30/2018   Influenza-Unspecified 05/05/2016, 05/03/2017, 05/30/2018   Moderna Sars-Covid-2 Vaccination 08/14/2019, 09/14/2019, 04/23/2020   Pneumococcal Conjugate-13 06/13/2017   Pneumococcal Polysaccharide-23 06/09/2015   Tdap 06/09/2015    TDAP status: Up to date  Flu Vaccine status: Up to date  Pneumococcal vaccine status: Up to date  Covid-19 vaccine status: Completed vaccines  Qualifies for Shingles Vaccine? Yes   Zostavax completed No   Shingrix Completed?: No.    Education has been provided regarding the importance of this vaccine. Patient has been advised to call insurance company to determine out of pocket expense if they have not yet received this vaccine. Advised may also receive vaccine at local pharmacy or Health Dept. Verbalized acceptance and understanding.  Screening Tests Health Maintenance  Topic Date Due   Zoster Vaccines- Shingrix (1 of 2) Never  done   COVID-19 Vaccine (4 - 2023-24  season) 04/02/2022   Medicare Annual Wellness (AWV)  06/29/2022   INFLUENZA VACCINE  10/31/2022 (Originally 03/02/2022)   Pneumonia Vaccine 59+ Years old  Completed   HPV VACCINES  Aged Out    Health Maintenance  Health Maintenance Due  Topic Date Due   Zoster Vaccines- Shingrix (1 of 2) Never done   COVID-19 Vaccine (4 - 2023-24 season) 04/02/2022   Medicare Annual Wellness (AWV)  06/29/2022    Colorectal cancer screening: No longer required.   Lung Cancer Screening: (Low Dose CT Chest recommended if Age 42-80 years, 30 pack-year currently smoking OR have quit w/in 15years.) does not qualify.   Lung Cancer Screening Referral: N/A  Additional Screening:  Hepatitis C Screening: does not qualify  Vision Screening: Recommended annual ophthalmology exams for early detection of glaucoma and other disorders of the eye. Is the patient up to date with their annual eye exam?  Yes  Who is the provider or what is the name of the office in which the patient attends annual eye exams? Has eye exam through the [place] If pt is not established with a provider, would they like to be referred to a provider to establish care? No .   Dental Screening: Recommended annual dental exams for proper oral hygiene  Community Resource Referral / Chronic Care Management: CRR required this visit?  No   CCM required this visit?   No     Plan:     I have personally reviewed and noted the following in the patient's chart:   Medical and social history Use of alcohol, tobacco or illicit drugs  Current medications and supplements including opioid prescriptions. Patient is not currently taking opioid prescriptions. Functional ability and status Nutritional status Physical activity Advanced directives List of other physicians Hospitalizations, surgeries, and ER visits in previous 12 months Vitals Screenings to include cognitive, depression, and falls Referrals and appointments  In addition, I have  reviewed and discussed with patient certain preventive protocols, quality metrics, and best practice recommendations. A written personalized care plan for preventive services as well as general preventive health recommendations were provided to patient.     Mariam Dollar LPN   [id number]

## 2022-07-01 NOTE — Telephone Encounter (Signed)
Patient aware.

## 2022-07-06 ENCOUNTER — Encounter: Payer: Self-pay | Admitting: Urology

## 2022-07-06 ENCOUNTER — Ambulatory Visit (INDEPENDENT_AMBULATORY_CARE_PROVIDER_SITE_OTHER): Payer: Medicare Other | Admitting: Urology

## 2022-07-06 ENCOUNTER — Other Ambulatory Visit: Payer: Self-pay | Admitting: Nurse Practitioner

## 2022-07-06 VITALS — BP 149/67 | HR 90

## 2022-07-06 DIAGNOSIS — N138 Other obstructive and reflux uropathy: Secondary | ICD-10-CM | POA: Diagnosis not present

## 2022-07-06 DIAGNOSIS — N401 Enlarged prostate with lower urinary tract symptoms: Secondary | ICD-10-CM

## 2022-07-06 DIAGNOSIS — I6381 Other cerebral infarction due to occlusion or stenosis of small artery: Secondary | ICD-10-CM | POA: Diagnosis not present

## 2022-07-06 DIAGNOSIS — R310 Gross hematuria: Secondary | ICD-10-CM | POA: Diagnosis not present

## 2022-07-06 DIAGNOSIS — I10 Essential (primary) hypertension: Secondary | ICD-10-CM

## 2022-07-06 LAB — MICROSCOPIC EXAMINATION: Bacteria, UA: NONE SEEN

## 2022-07-06 LAB — URINALYSIS, ROUTINE W REFLEX MICROSCOPIC
Bilirubin, UA: NEGATIVE
Glucose, UA: NEGATIVE
Ketones, UA: NEGATIVE
Leukocytes,UA: NEGATIVE
Nitrite, UA: NEGATIVE
RBC, UA: NEGATIVE
Specific Gravity, UA: 1.03 (ref 1.005–1.030)
Urobilinogen, Ur: 0.2 mg/dL (ref 0.2–1.0)
pH, UA: 5.5 (ref 5.0–7.5)

## 2022-07-06 MED ORDER — FINASTERIDE 5 MG PO TABS: 5.0000 mg | ORAL_TABLET | Freq: Every day | ORAL | 3 refills | Status: DC

## 2022-07-06 NOTE — Progress Notes (Signed)
H&P  Chief Complaint: Blood in urine  History of Present Illness: 81 yo male referred by [facility] for recent gross hematuria.  The patient has been having intermittent gross painless hematuria for over 10 years.  Turns out he was seen at [facility] urology both by myself in 2011 and by Dr. Dwana Curd in 2018.  He had a CT and cystoscopy in 2011 revealing probable prostatic source of his bleeding.  He had a CT but no cystoscopy in 2018-he did not follow-up for his cystoscopy.  He does have a very large prostate on CT scan, most recently done in 2018.  Upper tracts were normal.  He is not on any prostate medications.  Current IPSS is 7.  He is on Plavix.  Most recent gross hematuria episode was quite scary for him-he thought he was going to need a transfusion.  He was not admitted to the hospital for this.  The cultures taken at that time at his emergency room visit was negative.  Currently, his urine is clear.  He is having no dysuria.  Past Medical History:  Diagnosis Date   A-fib (HCC) 01/13/2012   Post operative AF   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 (HCC) 01/13/2012   PEA arrest on induction of stress test   Cardiomyopathy (HCC) 02/21/2012   Hypertension    Osteopenia 07/17/2021   Stroke (HCC)    Syncope, cardiogenic 01/10/2012    Past Surgical History:  Procedure Laterality Date   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; Surgeon: Donette Larry, MD; Location: [facility] MAIN OR; Service: Cardiothoracic; Laterality: N/A; Coronary Artery Bypass Graft x4 Utilizing Left Internal Mammary Artery and Left Saphenous Vein by Open Procedure   HERNIA REPAIR     x 2    Home Medications:  Allergies as of 07/06/2022       Reactions   Codeine Nausea Only, Rash   Lisinopril Cough, Other (See Comments)        Medication List        Accurate as of July 06, 2022  6:43 AM. If you have any questions, ask your nurse or doctor.           amLODipine 10 MG tablet Commonly known as: NORVASC TAKE ONE (1) TABLET BY MOUTH EVERY DAY   clopidogrel 75 MG tablet Commonly known as: PLAVIX Take 1 tablet (75 mg total) by mouth daily.   meclizine 25 MG tablet Commonly known as: ANTIVERT Take 1 tablet (25 mg total) by mouth 3 (three) times daily as needed for dizziness.   pravastatin 80 MG tablet Commonly known as: PRAVACHOL Take 1 tablet (80 mg total) by mouth daily.        Allergies:  Allergies  Allergen Reactions   Codeine Nausea Only and Rash   Lisinopril Cough and Other (See Comments)    Family History  Problem Relation Age of Onset   Heart failure Mother    Obesity Sister    Heart disease Sister     Social History:  reports that he has quit smoking. He has never used smokeless tobacco. He reports that he does not drink alcohol and does not use drugs.  ROS: A complete review of systems was performed.  All systems are negative except for pertinent findings as noted.  Physical Exam:  Vital signs in last 24 hours: There were no vitals taken for this visit. Constitutional:  Alert and oriented, No acute distress Cardiovascular:  Regular rate  Respiratory: Normal respiratory effort Neurologic: Grossly intact, no focal deficits Psychiatric: Normal mood and affect  I have reviewed prior pt notes  I have reviewed notes from referring/previous physicians  I have reviewed urinalysis results-clear today  I have independently reviewed prior imaging-CT images reviewed  I have reviewed prior PSA results-prior PSA in 2018 was 3.5  I have reviewed prior urine culture   Impression/Assessment:  1.  BPH with huge gland although patient not terribly symptomatic  2.  Gross hematuria, probably from his large prostate, exacerbated by Plavix  Plan:  1.  Because of his history of smoking, I will proceed with another hematuria workup  2.  I told him to stop his Plavix for a couple of days when he begins to bleed  again  3.  Labs drawn today-PSA, basic metabolic panel  4.  I will order a hematuria protocol CT abdomen and pelvis and bring him back for cystoscopy.

## 2022-07-07 LAB — BASIC METABOLIC PANEL
BUN/Creatinine Ratio: 26 — ABNORMAL HIGH (ref 10–24)
BUN: 27 mg/dL (ref 8–27)
CO2: 22 mmol/L (ref 20–29)
Calcium: 9.4 mg/dL (ref 8.6–10.2)
Chloride: 108 mmol/L — ABNORMAL HIGH (ref 96–106)
Creatinine, Ser: 1.02 mg/dL (ref 0.76–1.27)
Glucose: 113 mg/dL — ABNORMAL HIGH (ref 70–99)
Potassium: 4.4 mmol/L (ref 3.5–5.2)
Sodium: 147 mmol/L — ABNORMAL HIGH (ref 134–144)
eGFR: 73 mL/min/{1.73_m2} (ref >59)

## 2022-07-09 ENCOUNTER — Encounter: Payer: Self-pay | Admitting: Nurse Practitioner

## 2022-07-09 ENCOUNTER — Ambulatory Visit (INDEPENDENT_AMBULATORY_CARE_PROVIDER_SITE_OTHER): Payer: Medicare Other | Admitting: Nurse Practitioner

## 2022-07-09 VITALS — BP 129/70 | HR 105 | Temp 97.9°F | Resp 20 | Ht 73.0 in | Wt 133.0 lb

## 2022-07-09 DIAGNOSIS — M8588 Other specified disorders of bone density and structure, other site: Secondary | ICD-10-CM

## 2022-07-09 DIAGNOSIS — I6381 Other cerebral infarction due to occlusion or stenosis of small artery: Secondary | ICD-10-CM

## 2022-07-09 DIAGNOSIS — I10 Essential (primary) hypertension: Secondary | ICD-10-CM

## 2022-07-09 DIAGNOSIS — E782 Mixed hyperlipidemia: Secondary | ICD-10-CM

## 2022-07-09 DIAGNOSIS — Z23 Encounter for immunization: Secondary | ICD-10-CM

## 2022-07-09 DIAGNOSIS — R351 Nocturia: Secondary | ICD-10-CM

## 2022-07-09 DIAGNOSIS — I2510 Atherosclerotic heart disease of native coronary artery without angina pectoris: Secondary | ICD-10-CM

## 2022-07-09 DIAGNOSIS — N401 Enlarged prostate with lower urinary tract symptoms: Secondary | ICD-10-CM | POA: Diagnosis not present

## 2022-07-09 DIAGNOSIS — Z8673 Personal history of transient ischemic attack (TIA), and cerebral infarction without residual deficits: Secondary | ICD-10-CM

## 2022-07-09 MED ORDER — PRAVASTATIN SODIUM 80 MG PO TABS: 80.0000 mg | ORAL_TABLET | Freq: Every day | ORAL | 1 refills | Status: DC

## 2022-07-09 MED ORDER — AMLODIPINE BESYLATE 10 MG PO TABS: 10.0000 mg | ORAL_TABLET | Freq: Every day | ORAL | 1 refills | Status: DC

## 2022-07-09 MED ORDER — CLOPIDOGREL BISULFATE 75 MG PO TABS: 75.0000 mg | ORAL_TABLET | Freq: Every day | ORAL | 1 refills | Status: DC

## 2022-07-09 NOTE — Progress Notes (Signed)
Subjective:    Patient ID: Stephen Cross, male    DOB: 09-Jul-1941, 81 y.o.   MRN: [id number]  Chief Complaint: medical management of chronic issues     HPI:  Stephen Cross is a 81 y.o. who identifies as a male who was assigned male at birth.   Social history: Lives with: wife Work history: retired from [employer] in today for follow up of the following chronic medical issues:  1. Essential hypertension No c/o chest pain, sob or headache. Does not check blood pressure at home. BP Readings from Last 3 Encounters:  07/06/22 (!) 149/67  06/21/22 138/72  04/08/22 (!) 141/74     2. Chronic coronary artery disease Has not seen cardiology in the last 2 years.   3. Mixed hyperlipidemia Tries  to watch diet and stays very active Lab Results  Component Value Date   CHOL 129 01/14/2022   HDL 42 01/14/2022   LDLCALC 68 01/14/2022   TRIG 103 01/14/2022   CHOLHDL 3.1 01/14/2022     4. Benign prostatic hyperplasia with nocturia  Takes proscar daily. Saw urology on 07/06/22. Has been having hematuria. Last episode was bad he says. They stopped his plavix and have ordered a CT of abdomen and pelvis.  5. Osteopenia of lumbar spine Last dexascan was done 07/16/21. T score was -1.9. does occasional weight bearing exercise.   New complaints: None today  Allergies  Allergen Reactions   Codeine Nausea Only and Rash   Lisinopril Cough and Other (See Comments)   Outpatient Encounter Medications as of 07/09/2022  Medication Sig   amLODipine (NORVASC) 10 MG tablet TAKE ONE (1) TABLET BY MOUTH EVERY DAY   clopidogrel (PLAVIX) 75 MG tablet Take 1 tablet (75 mg total) by mouth daily.   finasteride (PROSCAR) 5 MG tablet Take 1 tablet (5 mg total) by mouth daily.   meclizine (ANTIVERT) 25 MG tablet Take 1 tablet (25 mg total) by mouth 3 (three) times daily as needed for dizziness.   pravastatin (PRAVACHOL) 80 MG tablet Take 1 tablet (80 mg total) by mouth daily.   No  facility-administered encounter medications on file as of 07/09/2022.    Past Surgical History:  Procedure Laterality Date   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; Surgeon: Ranee Gosselin, MD; Location: [place]; Service: Cardiothoracic; Laterality: N/A; Coronary Artery Bypass Graft x4 Utilizing Left Internal Mammary Artery and Left Saphenous Vein by Open Procedure   HERNIA REPAIR     x 2    Family History  Problem Relation Age of Onset   Heart failure Mother    Obesity Sister    Heart disease Sister       Controlled substance contract: n/a     Review of Systems  Constitutional:  Negative for diaphoresis.  Eyes:  Negative for pain.  Respiratory:  Negative for shortness of breath.   Cardiovascular:  Negative for chest pain, palpitations and leg swelling.  Gastrointestinal:  Negative for abdominal pain.  Endocrine: Negative for polydipsia.  Skin:  Negative for rash.  Neurological:  Negative for dizziness, weakness and headaches.  Hematological:  Does not bruise/bleed easily.  All other systems reviewed and are negative.      Objective:   Physical Exam Vitals and nursing note reviewed.  Constitutional:      Appearance: Normal appearance. He is well-developed.  HENT:     Head: Normocephalic.  Nose: Nose normal.     Mouth/Throat:     Mouth: Mucous membranes are moist.     Pharynx: Oropharynx is clear.  Eyes:     Pupils: Pupils are equal, round, and reactive to light.  Neck:     Thyroid: No thyroid mass or thyromegaly.     Vascular: No carotid bruit or JVD.     Trachea: Phonation normal.  Cardiovascular:     Rate and Rhythm: Normal rate and regular rhythm.  Pulmonary:     Effort: Pulmonary effort is normal. No respiratory distress.     Breath sounds: Normal breath sounds.  Abdominal:     General: Bowel sounds are normal.     Palpations: Abdomen is soft.     Tenderness: There is no abdominal tenderness.   Musculoskeletal:        General: Normal range of motion.     Cervical back: Normal range of motion and neck supple.  Lymphadenopathy:     Cervical: No cervical adenopathy.  Skin:    General: Skin is warm and dry.  Neurological:     Mental Status: He is alert and oriented to person, place, and time.  Psychiatric:        Behavior: Behavior normal.        Thought Content: Thought content normal.        Judgment: Judgment normal.     BP 129/70   Pulse (!) 105   Temp 97.9 F (36.6 C) (Temporal)   Resp 20   Ht _0  (1.854 m)   Wt 133 lb (60.3 kg)   SpO2 99%   BMI 17.55 kg/m        Assessment & Plan:   Stephen Cross comes in today with chief complaint of Medical Management of Chronic Issues   Diagnosis and orders addressed:  1. Essential hypertension Low sodium diet - amLODipine (NORVASC) 10 MG tablet; Take 1 tablet (10 mg total) by mouth daily.  Dispense: 30 tablet; Refill: 1 - CBC with Differential/Platelet - CMP14+EGFR  2. Chronic coronary artery disease Keep follow up with cardiology as needed  3. Mixed hyperlipidemia Low fat diet - pravastatin (PRAVACHOL) 80 MG tablet; Take 1 tablet (80 mg total) by mouth daily.  Dispense: 90 tablet; Refill: 1 - Lipid panel  4. Benign prostatic hyperplasia with nocturia Keep appointments with urology  5. Osteopenia of lumbar spine Weight bearing exercises  6. History of stroke Report any one sided weakness - clopidogrel (PLAVIX) 75 MG tablet; Take 1 tablet (75 mg total) by mouth daily.  Dispense: 90 tablet; Refill: 1   Labs pending Health Maintenance reviewed Diet and exercise encouraged  Follow up plan: 6 months   Mary-Margaret Hassell Done, FNP

## 2022-07-09 NOTE — Patient Instructions (Signed)

## 2022-07-09 NOTE — Addendum Note (Signed)
Addended by: Bennie Pierini on: 07/09/2022 10:02 AM   Modules accepted: Level of Service

## 2022-07-12 ENCOUNTER — Telehealth: Payer: Self-pay | Admitting: Nurse Practitioner

## 2022-07-12 ENCOUNTER — Other Ambulatory Visit: Payer: Self-pay

## 2022-07-12 ENCOUNTER — Other Ambulatory Visit: Payer: Medicare Other

## 2022-07-12 DIAGNOSIS — E875 Hyperkalemia: Secondary | ICD-10-CM

## 2022-07-12 LAB — CMP14+EGFR
ALT: 6 IU/L (ref 0–44)
AST: 17 IU/L (ref 0–40)
Albumin/Globulin Ratio: 2 (ref 1.2–2.2)
Albumin: 4.6 g/dL (ref 3.7–4.7)
Alkaline Phosphatase: 86 IU/L (ref 44–121)
BUN/Creatinine Ratio: 20 (ref 10–24)
BUN: 24 mg/dL (ref 8–27)
Bilirubin Total: 0.3 mg/dL (ref 0.0–1.2)
CO2: 22 mmol/L (ref 20–29)
Calcium: 9.8 mg/dL (ref 8.6–10.2)
Chloride: 108 mmol/L — ABNORMAL HIGH (ref 96–106)
Creatinine, Ser: 1.19 mg/dL (ref 0.76–1.27)
Globulin, Total: 2.3 g/dL (ref 1.5–4.5)
Glucose: 78 mg/dL (ref 70–99)
Potassium: 5.9 mmol/L (ref 3.5–5.2)
Sodium: 145 mmol/L — ABNORMAL HIGH (ref 134–144)
Total Protein: 6.9 g/dL (ref 6.0–8.5)
eGFR: 61 mL/min/1.73 (ref >59)

## 2022-07-12 LAB — LIPID PANEL
Chol/HDL Ratio: 3.2 ratio (ref 0.0–5.0)
Cholesterol, Total: 149 mg/dL (ref 100–199)
HDL: 46 mg/dL (ref >39)
LDL Chol Calc (NIH): 80 mg/dL (ref 0–99)
Triglycerides: 133 mg/dL (ref 0–149)
VLDL Cholesterol Cal: 23 mg/dL (ref 5–40)

## 2022-07-12 LAB — CBC WITH DIFFERENTIAL/PLATELET
Basophils Absolute: 0.1 x10E3/uL (ref 0.0–0.2)
Basos: 1 %
EOS (ABSOLUTE): 1.2 x10E3/uL — ABNORMAL HIGH (ref 0.0–0.4)
Eos: 13 %
Hematocrit: 36.3 % — ABNORMAL LOW (ref 37.5–51.0)
Hemoglobin: 11.6 g/dL — ABNORMAL LOW (ref 13.0–17.7)
Immature Grans (Abs): 0.1 x10E3/uL (ref 0.0–0.1)
Immature Granulocytes: 1 %
Lymphocytes Absolute: 1.6 x10E3/uL (ref 0.7–3.1)
Lymphs: 18 %
MCH: 26.5 pg — ABNORMAL LOW (ref 26.6–33.0)
MCHC: 32 g/dL (ref 31.5–35.7)
MCV: 83 fL (ref 79–97)
Monocytes Absolute: 1 x10E3/uL — ABNORMAL HIGH (ref 0.1–0.9)
Monocytes: 11 %
Neutrophils Absolute: 5.1 x10E3/uL (ref 1.4–7.0)
Neutrophils: 56 %
Platelets: 322 x10E3/uL (ref 150–450)
RBC: 4.37 x10E6/uL (ref 4.14–5.80)
RDW: 13 % (ref 11.6–15.4)
WBC: 9 x10E3/uL (ref 3.4–10.8)

## 2022-07-12 NOTE — Telephone Encounter (Signed)
Clydie Braun called from [employer] to give Critical Lab Result.  Potassium 5.9

## 2022-07-12 NOTE — Telephone Encounter (Signed)
Contacted patient and notified wife of results. Advised to return to office for a redraw asap and patient stated that he would. Future orders placed

## 2022-07-13 LAB — BMP8+EGFR
BUN/Creatinine Ratio: 20 (ref 10–24)
BUN: 21 mg/dL (ref 8–27)
CO2: 21 mmol/L (ref 20–29)
Calcium: 9.3 mg/dL (ref 8.6–10.2)
Chloride: 105 mmol/L (ref 96–106)
Creatinine, Ser: 1.04 mg/dL (ref 0.76–1.27)
Glucose: 145 mg/dL — ABNORMAL HIGH (ref 70–99)
Potassium: 5 mmol/L (ref 3.5–5.2)
Sodium: 141 mmol/L (ref 134–144)
eGFR: 72 mL/min/{1.73_m2} (ref >59)

## 2022-07-14 ENCOUNTER — Telehealth: Payer: Self-pay | Admitting: Nurse Practitioner

## 2022-07-14 NOTE — Telephone Encounter (Signed)
Patient notified of lab results and verbalized understanding.  ° °

## 2022-08-10 ENCOUNTER — Ambulatory Visit: Payer: TRICARE For Life (TFL) | Admitting: Urology

## 2022-08-29 NOTE — Progress Notes (Signed)
History of Present Illness:   12.5.2023: Pt w/ h/o BPH presented w/ multiple episodes of gross hematuria.  1.30.2024: Here for cysto. He has not undergone CT A/P hematuria protocol as ordered.  Past Medical History:  Diagnosis Date   A-fib ([place]) 01/13/2012   Post operative AF   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 ([place]) 01/13/2012   PEA arrest on induction of stress test   Cardiomyopathy ([place]) 02/21/2012   Hypertension    Osteopenia 07/17/2021   Stroke ([place])    Syncope, cardiogenic 01/10/2012    Past Surgical History:  Procedure Laterality Date   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; Surgeon: Ranee Gosselin, MD; Location: [facility]; Service: Cardiothoracic; Laterality: N/A; Coronary Artery Bypass Graft x4 Utilizing Left Internal Mammary Artery and Left Saphenous Vein by Open Procedure   HERNIA REPAIR     x 2    Home Medications:  Allergies as of 08/31/2022       Reactions   Codeine Nausea Only, Rash   Lisinopril Cough, Other (See Comments)        Medication List        Accurate as of August 29, 2022  6:14 PM. If you have any questions, ask your nurse or doctor.          amLODipine 10 MG tablet Commonly known as: NORVASC Take 1 tablet (10 mg total) by mouth daily.   clopidogrel 75 MG tablet Commonly known as: PLAVIX Take 1 tablet (75 mg total) by mouth daily.   finasteride 5 MG tablet Commonly known as: PROSCAR Take 1 tablet (5 mg total) by mouth daily.   meclizine 25 MG tablet Commonly known as: ANTIVERT Take 1 tablet (25 mg total) by mouth 3 (three) times daily as needed for dizziness.   pravastatin 80 MG tablet Commonly known as: PRAVACHOL Take 1 tablet (80 mg total) by mouth daily.        Allergies:  Allergies  Allergen Reactions   Codeine Nausea Only and Rash   Lisinopril Cough and Other (See Comments)    Family History  Problem Relation Age of Onset   Heart failure Mother     Obesity Sister    Heart disease Sister     Social History:  reports that he has quit smoking. He has never used smokeless tobacco. He reports that he does not drink alcohol and does not use drugs.  ROS: A complete review of systems was performed.  All systems are negative except for pertinent findings as noted.  Physical Exam:  Vital signs in last 24 hours: There were no vitals taken for this visit. Constitutional:  Alert and oriented, No acute distress Cardiovascular: Regular rate  Respiratory: Normal respiratory effort GI: Abdomen is soft, nontender, nondistended, no abdominal masses. No CVAT.  Genitourinary: Normal male phallus, testes are descended bilaterally and non-tender and without masses, scrotum is normal in appearance without lesions or masses, perineum is normal on inspection. Lymphatic: No lymphadenopathy Neurologic: Grossly intact, no focal deficits Psychiatric: Normal mood and affect  I have reviewed prior pt notes  I have reviewed notes from referring/previous physicians  I have reviewed urinalysis results  I have independently reviewed prior imaging  I have reviewed prior PSA results  I have reviewed prior urine culture  Cystoscopy Procedure Note:  Indication: ***  After informed consent and discussion of the procedure and its risks, ELIUS ETHEREDGE was positioned and prepped in the  standard fashion.  Cystoscopy was performed with a flexible cystoscope.   Findings: Urethra:*** Prostate:*** Bladder neck:*** Ureteral orifices:*** Bladder:***  The patient tolerated the procedure well.    Impression/Assessment:  ***  Plan:  ***

## 2022-08-31 ENCOUNTER — Ambulatory Visit (INDEPENDENT_AMBULATORY_CARE_PROVIDER_SITE_OTHER): Payer: Medicare Other | Admitting: Urology

## 2022-08-31 VITALS — BP 105/69 | HR 101

## 2022-08-31 DIAGNOSIS — N138 Other obstructive and reflux uropathy: Secondary | ICD-10-CM | POA: Diagnosis not present

## 2022-08-31 DIAGNOSIS — N401 Enlarged prostate with lower urinary tract symptoms: Secondary | ICD-10-CM

## 2022-08-31 DIAGNOSIS — R310 Gross hematuria: Secondary | ICD-10-CM

## 2022-08-31 MED ORDER — CIPROFLOXACIN HCL 500 MG PO TABS
500.0000 mg | ORAL_TABLET | Freq: Once | ORAL | Status: AC
  Administered 2022-08-31: 500 mg via ORAL

## 2022-08-31 NOTE — Addendum Note (Signed)
Addended by: Darcella Gasman R on: 08/31/2022 09:29 AM   Modules accepted: Orders

## 2022-09-01 ENCOUNTER — Other Ambulatory Visit: Payer: Self-pay | Admitting: Nurse Practitioner

## 2022-09-01 DIAGNOSIS — I10 Essential (primary) hypertension: Secondary | ICD-10-CM

## 2022-10-04 ENCOUNTER — Encounter (HOSPITAL_COMMUNITY): Payer: Self-pay | Admitting: Radiology

## 2022-10-04 ENCOUNTER — Ambulatory Visit (HOSPITAL_COMMUNITY)
Admission: RE | Admit: 2022-10-04 | Discharge: 2022-10-04 | Disposition: A | Discharge status: Home / Self Care | Payer: Medicare Other | Source: Ambulatory Visit | Attending: Urology | Admitting: Urology

## 2022-10-04 DIAGNOSIS — R310 Gross hematuria: Secondary | ICD-10-CM | POA: Insufficient documentation

## 2022-10-04 LAB — POCT I-STAT CREATININE: Creatinine, Ser: 1.1 mg/dL (ref 0.61–1.24)

## 2022-10-04 MED ORDER — IOHEXOL 350 MG/ML SOLN
100.0000 mL | Freq: Once | INTRAVENOUS | Status: AC | PRN
  Administered 2022-10-04: 100 mL via INTRAVENOUS

## 2022-10-08 ENCOUNTER — Telehealth: Payer: Self-pay

## 2022-10-08 ENCOUNTER — Other Ambulatory Visit: Payer: Self-pay | Admitting: Urology

## 2022-10-08 DIAGNOSIS — R911 Solitary pulmonary nodule: Secondary | ICD-10-CM

## 2022-10-08 NOTE — Telephone Encounter (Signed)
Patient's wife called requesting results of his CT scan done on 03/04.  Can you please follow up with patient.

## 2022-10-08 NOTE — Telephone Encounter (Signed)
Return called to patient. Patient is aware that results just came in and once the MD review the CT scan, someone will reach out to him about the results. Patient voiced understanding.

## 2022-10-13 ENCOUNTER — Telehealth: Payer: Self-pay

## 2022-10-13 NOTE — Telephone Encounter (Signed)
Tried calling patient and was told patient spoke with MD yesterday. Made patient/wife aware to call back to scheduled f/u when he is able. Patient/wife voiced understanding

## 2022-10-13 NOTE — Telephone Encounter (Signed)
-----   Message from Franchot Gallo, MD sent at 10/12/2022  4:46 PM EDT ----- I finally got patient with results.  Please call him to set up an appointment to discuss findings and further evaluation.  Not an urgent matter. ----- Message ----- From: Sherrilyn Rist, CMA Sent: 10/05/2022   2:12 PM EDT To: Franchot Gallo, MD  Please review

## 2023-01-05 ENCOUNTER — Other Ambulatory Visit: Payer: Self-pay | Admitting: Nurse Practitioner

## 2023-01-05 DIAGNOSIS — I10 Essential (primary) hypertension: Secondary | ICD-10-CM

## 2023-01-14 ENCOUNTER — Ambulatory Visit (INDEPENDENT_AMBULATORY_CARE_PROVIDER_SITE_OTHER): Payer: Medicare Other | Admitting: Nurse Practitioner

## 2023-01-14 ENCOUNTER — Encounter: Payer: Self-pay | Admitting: Nurse Practitioner

## 2023-01-14 VITALS — BP 132/65 | HR 76 | Temp 97.2°F | Resp 20 | Ht 73.0 in | Wt 135.0 lb

## 2023-01-14 DIAGNOSIS — R911 Solitary pulmonary nodule: Secondary | ICD-10-CM

## 2023-01-14 DIAGNOSIS — I2510 Atherosclerotic heart disease of native coronary artery without angina pectoris: Secondary | ICD-10-CM

## 2023-01-14 DIAGNOSIS — Z8673 Personal history of transient ischemic attack (TIA), and cerebral infarction without residual deficits: Secondary | ICD-10-CM

## 2023-01-14 DIAGNOSIS — M8588 Other specified disorders of bone density and structure, other site: Secondary | ICD-10-CM | POA: Diagnosis not present

## 2023-01-14 DIAGNOSIS — N401 Enlarged prostate with lower urinary tract symptoms: Secondary | ICD-10-CM

## 2023-01-14 DIAGNOSIS — E782 Mixed hyperlipidemia: Secondary | ICD-10-CM

## 2023-01-14 DIAGNOSIS — I10 Essential (primary) hypertension: Secondary | ICD-10-CM | POA: Diagnosis not present

## 2023-01-14 DIAGNOSIS — R351 Nocturia: Secondary | ICD-10-CM

## 2023-01-14 MED ORDER — PRAVASTATIN SODIUM 80 MG PO TABS: 80.0000 mg | ORAL_TABLET | Freq: Every day | ORAL | 1 refills | Status: DC

## 2023-01-14 MED ORDER — CLOPIDOGREL BISULFATE 75 MG PO TABS: 75.0000 mg | ORAL_TABLET | Freq: Every day | ORAL | 1 refills | Status: DC

## 2023-01-14 MED ORDER — AMLODIPINE BESYLATE 10 MG PO TABS: 10.0000 mg | ORAL_TABLET | Freq: Every day | ORAL | 1 refills | Status: DC

## 2023-01-14 NOTE — Progress Notes (Signed)
Subjective:    Patient ID: Stephen Cross, male    DOB: [birth year], 82 y.o.   MRN: [id number]   Chief Complaint: Medical Management of Chronic Issues    HPI:  Stephen Cross is a 45 y.o. who identifies as a male who was assigned male at birth.   Social history: Lives with: wife Work history: retired from [employer] in today for follow up of the following chronic medical issues:  1. Essential hypertension Denies chest pain, sob, or headaches. Rarely checks BP at home. BP Readings from Last 3 Encounters:  01/14/23 132/65  08/31/22 105/69  07/09/22 129/70    2. Chronic coronary artery disease Has not seen cardiology in a while; does not have a f/u scheduled and does not want to unless he has issues.  3. Mixed hyperlipidemia Tries to watch his diet; stays active. Lab Results  Component Value Date   CHOL 149 07/09/2022   HDL 46 07/09/2022   LDLCALC 80 07/09/2022   TRIG 133 07/09/2022   CHOLHDL 3.2 07/09/2022   The ASCVD Risk score (Arnett DK, et al., 2019) failed to calculate for the following reasons:   The 2019 ASCVD risk score is only valid for ages 28 to 42   The patient has a prior MI or stroke diagnosis  4. Osteopenia of lumbar spine Last DEXA done 07/16/21 with t-score of -1.9; occasionally does weight bearing exercises.  5. Benign prostatic hyperplasia with nocturia States he does well on Proscar. Denies any hematuria since he saw urology in December 2023. Had CT chest/abdomen/pelvis in March; has not seen urology since; needs to f/u with urology.   New complaints: None   Allergies  Allergen Reactions   Codeine Nausea Only and Rash   Lisinopril Cough and Other (See Comments)   Outpatient Encounter Medications as of 01/14/2023  Medication Sig   amLODipine (NORVASC) 10 MG tablet TAKE ONE (1) TABLET BY MOUTH EVERY DAY   clopidogrel (PLAVIX) 75 MG tablet Take 1 tablet (75 mg total) by mouth daily.   finasteride (PROSCAR) 5 MG tablet Take 1 tablet (5 mg  total) by mouth daily.   meclizine (ANTIVERT) 25 MG tablet Take 1 tablet (25 mg total) by mouth 3 (three) times daily as needed for dizziness.   pravastatin (PRAVACHOL) 80 MG tablet Take 1 tablet (80 mg total) by mouth daily.   No facility-administered encounter medications on file as of 01/14/2023.    Past Surgical History:  Procedure Laterality Date   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; Surgeon: Donette Larry, MD; Location: [facility] MAIN OR; Service: Cardiothoracic; Laterality: N/A; Coronary Artery Bypass Graft x4 Utilizing Left Internal Mammary Artery and Left Saphenous Vein by Open Procedure   HERNIA REPAIR     x 2    Family History  Problem Relation Age of Onset   Heart failure Mother    Obesity Sister    Heart disease Sister       Controlled substance contract: n/a     Review of Systems  Constitutional:  Negative for activity change, appetite change, fatigue and unexpected weight change.  HENT:  Negative for ear pain and sinus pressure.   Eyes:  Negative for pain.  Respiratory:  Negative for cough, chest tightness and shortness of breath.   Cardiovascular:  Negative for chest pain, palpitations and leg swelling.  Gastrointestinal:  Negative for abdominal pain and blood in stool.  Endocrine: Negative for  cold intolerance, heat intolerance, polydipsia and polyphagia.  Genitourinary:  Negative for decreased urine volume, dysuria and hematuria.  Musculoskeletal:  Negative for arthralgias, joint swelling and myalgias.  Skin:  Negative for color change and rash.  Neurological:  Negative for syncope, weakness and headaches.  Hematological:  Negative for adenopathy.  All other systems reviewed and are negative.      Objective:   Physical Exam Vitals and nursing note reviewed.  Constitutional:      General: He is not in acute distress.    Appearance: Normal appearance. He is not ill-appearing.  HENT:     Head: Normocephalic  and atraumatic.     Right Ear: Tympanic membrane normal.     Left Ear: Tympanic membrane normal.     Nose: Nose normal.     Mouth/Throat:     Mouth: Mucous membranes are moist.     Pharynx: Oropharynx is clear.  Eyes:     Conjunctiva/sclera: Conjunctivae normal.     Pupils: Pupils are equal, round, and reactive to light.  Cardiovascular:     Rate and Rhythm: Normal rate and regular rhythm.     Pulses: Normal pulses.     Heart sounds: Normal heart sounds.  Pulmonary:     Effort: Pulmonary effort is normal. No respiratory distress.     Breath sounds: Normal breath sounds. No wheezing, rhonchi or rales.  Abdominal:     General: Abdomen is flat. Bowel sounds are normal. There is no distension.     Palpations: Abdomen is soft. There is no mass.     Tenderness: There is no abdominal tenderness.     Hernia: No hernia is present.  Musculoskeletal:        General: Normal range of motion.     Cervical back: Normal range of motion. No rigidity or tenderness.     Right lower leg: No edema.     Left lower leg: No edema.  Lymphadenopathy:     Cervical: No cervical adenopathy.  Skin:    General: Skin is warm and dry.     Capillary Refill: Capillary refill takes 2 to 3 seconds.  Neurological:     General: No focal deficit present.     Mental Status: He is alert and oriented to person, place, and time.  Psychiatric:        Mood and Affect: Mood normal.        Behavior: Behavior normal.      BP 132/65   Pulse 76   Temp (!) 97.2 F (36.2 C) (Temporal)   Resp 20   Ht 6\' 1"  (1.854 m)   Wt 135 lb (61.2 kg)   SpO2 100%   BMI 17.81 kg/m       Assessment & Plan:   [name] comes in today with chief complaint of Medical Management of Chronic Issues   Diagnosis and orders addressed:  1. Essential hypertension Low sodium diet - CBC with Differential/Platelet - CMP14+EGFR - amLODipine (NORVASC) 10 MG tablet; Take 1 tablet (10 mg total) by mouth daily.  Dispense: 90 tablet;  Refill: 1  2. Chronic coronary artery disease Keep follow up with cardiology  3. Mixed hyperlipidemia Low fat diet - Lipid panel - pravastatin (PRAVACHOL) 80 MG tablet; Take 1 tablet (80 mg total) by mouth daily.  Dispense: 90 tablet; Refill: 1  4. Osteopenia of lumbar spine Weight bearing exercise  5. Benign prostatic hyperplasia with nocturia Report any voiding issues  6. Lung nodule - CT Chest  W Contrast; Future  7. History of stroke - clopidogrel (PLAVIX) 75 MG tablet; Take 1 tablet (75 mg total) by mouth daily.  Dispense: 90 tablet; Refill: 1   Labs pending Health Maintenance reviewed Diet and exercise encouraged  Follow up plan: 6 months   Mary-Margaret Daphine Deutscher, FNP

## 2023-01-14 NOTE — Patient Instructions (Signed)
Fall Prevention in the Home, Adult Falls can cause injuries and can happen to people of all ages. There are many things you can do to make your home safer and to help prevent falls. What actions can I take to prevent falls? General information Use good lighting in all rooms. Make sure to: Replace any light bulbs that burn out. Turn on the lights in dark areas and use night-lights. Keep items that you use often in easy-to-reach places. Lower the shelves around your home if needed. Move furniture so that there are clear paths around it. Do not use throw rugs or other things on the floor that can make you trip. If any of your floors are uneven, fix them. Add color or contrast paint or tape to clearly mark and help you see: Grab bars or handrails. First and last steps of staircases. Where the edge of each step is. If you use a ladder or stepladder: Make sure that it is fully opened. Do not climb a closed ladder. Make sure the sides of the ladder are locked in place. Have someone hold the ladder while you use it. Know where your pets are as you move through your home. What can I do in the bathroom?     Keep the floor dry. Clean up any water on the floor right away. Remove soap buildup in the bathtub or shower. Buildup makes bathtubs and showers slippery. Use non-skid mats or decals on the floor of the bathtub or shower. Attach bath mats securely with double-sided, non-slip rug tape. If you need to sit down in the shower, use a non-slip stool. Install grab bars by the toilet and in the bathtub and shower. Do not use towel bars as grab bars. What can I do in the bedroom? Make sure that you have a light by your bed that is easy to reach. Do not use any sheets or blankets on your bed that hang to the floor. Have a firm chair or bench with side arms that you can use for support when you get dressed. What can I do in the kitchen? Clean up any spills right away. If you need to reach something  above you, use a step stool with a grab bar. Keep electrical cords out of the way. Do not use floor polish or wax that makes floors slippery. What can I do with my stairs? Do not leave anything on the stairs. Make sure that you have a light switch at the top and the bottom of the stairs. Make sure that there are handrails on both sides of the stairs. Fix handrails that are broken or loose. Install non-slip stair treads on all your stairs if they do not have carpet. Avoid having throw rugs at the top or bottom of the stairs. Choose a carpet that does not hide the edge of the steps on the stairs. Make sure that the carpet is firmly attached to the stairs. Fix carpet that is loose or worn. What can I do on the outside of my home? Use bright outdoor lighting. Fix the edges of walkways and driveways and fix any cracks. Clear paths of anything that can make you trip, such as tools or rocks. Add color or contrast paint or tape to clearly mark and help you see anything that might make you trip as you walk through a door, such as a raised step or threshold. Trim any bushes or trees on paths to your home. Check to see if handrails are loose   or broken and that both sides of all steps have handrails. Install guardrails along the edges of any raised decks and porches. Have leaves, snow, or ice cleared regularly. Use sand, salt, or ice melter on paths if you live where there is ice and snow during the winter. Clean up any spills in your garage right away. This includes grease or oil spills. What other actions can I take? Review your medicines with your doctor. Some medicines can cause dizziness or changes in blood pressure, which increase your risk of falling. Wear shoes that: Have a low heel. Do not wear high heels. Have rubber bottoms and are closed at the toe. Feel good on your feet and fit well. Use tools that help you move around if needed. These include: Canes. Walkers. Scooters. Crutches. Ask  your doctor what else you can do to help prevent falls. This may include seeing a physical therapist to learn to do exercises to move better and get stronger. Where to find more information Centers for Disease Control and Prevention, [employer]: cdc.gov National Institute on Aging: nia.nih.gov National Institute on Aging: nia.nih.gov Contact a doctor if: You are afraid of falling at home. You feel weak, drowsy, or dizzy at home. You fall at home. Get help right away if you: Lose consciousness or have trouble moving after a fall. Have a fall that causes a head injury. These symptoms may be an emergency. Get help right away. Call 911. Do not wait to see if the symptoms will go away. Do not drive yourself to the hospital. This information is not intended to replace advice given to you by your health care provider. Make sure you discuss any questions you have with your health care provider. Document Revised: 03/22/2022 Document Reviewed: 03/22/2022 Elsevier Patient Education  2024 Elsevier Inc.  

## 2023-03-09 ENCOUNTER — Ambulatory Visit (HOSPITAL_COMMUNITY): Payer: Medicare Other

## 2023-05-07 ENCOUNTER — Other Ambulatory Visit: Payer: Self-pay | Admitting: Urology

## 2023-05-07 DIAGNOSIS — N401 Enlarged prostate with lower urinary tract symptoms: Secondary | ICD-10-CM

## 2023-07-01 IMAGING — CT CT HEAD W/O CM
3 series · 16 of 47 positions shown, 19 images · non-contrast
Comparison: None.

CLINICAL DATA: Right-sided peripheral vision loss 5 days ago.

EXAM:
CT HEAD WITHOUT CONTRAST
TECHNIQUE: Contiguous axial images were obtained from the base of the skull
through the vertex without intravenous contrast.

[Series 2: head w o · axial · 0.44mm/px · z∈[+32,+172]mm · 10 of 34 slices shown, 13 images]
[im 3/34  brain]
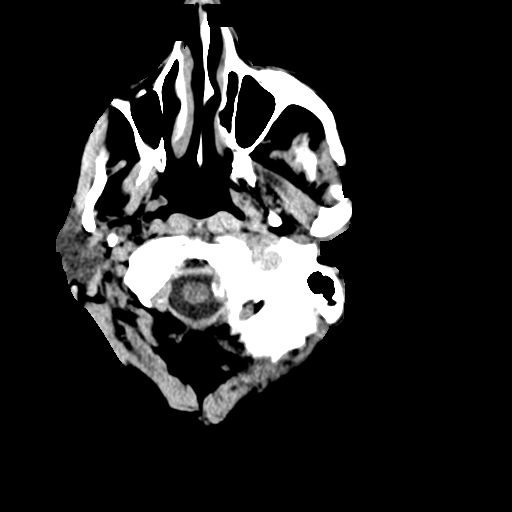
[im 3/34  bone]
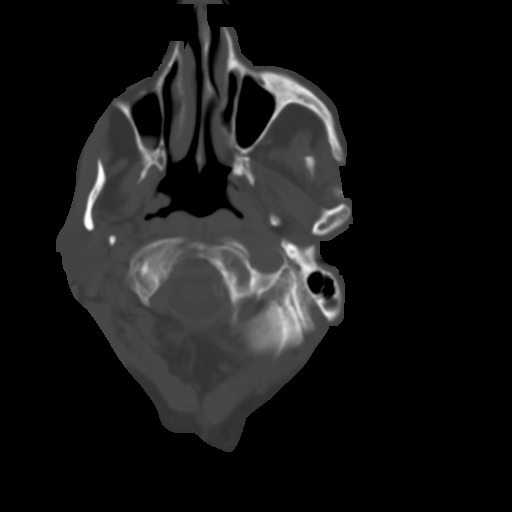
[im 6/34  brain]
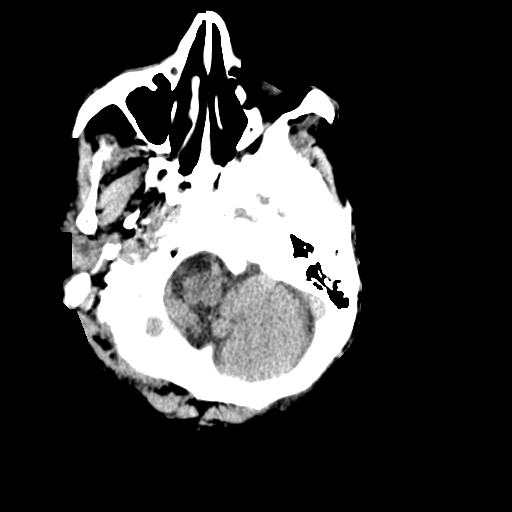
[im 10/34  brain]
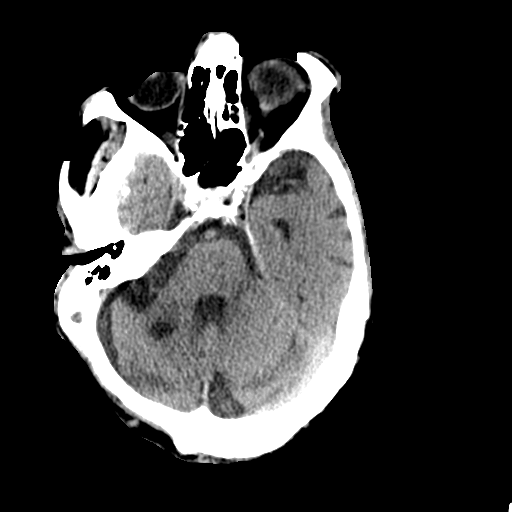
[im 12/34  brain]
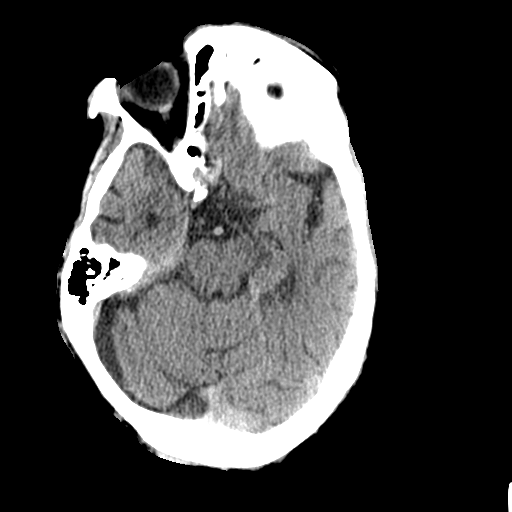
[im 15/34  brain]
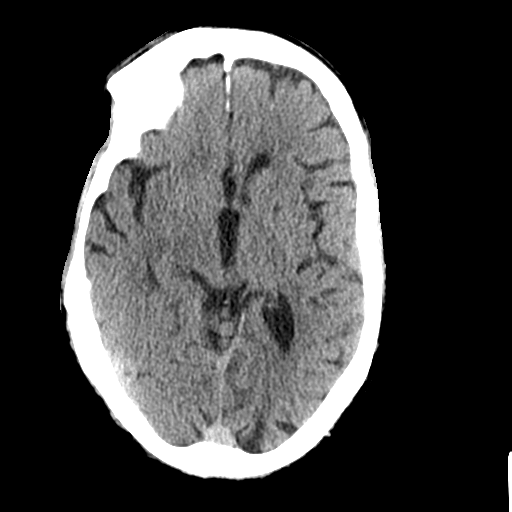
[im 15/34  bone]
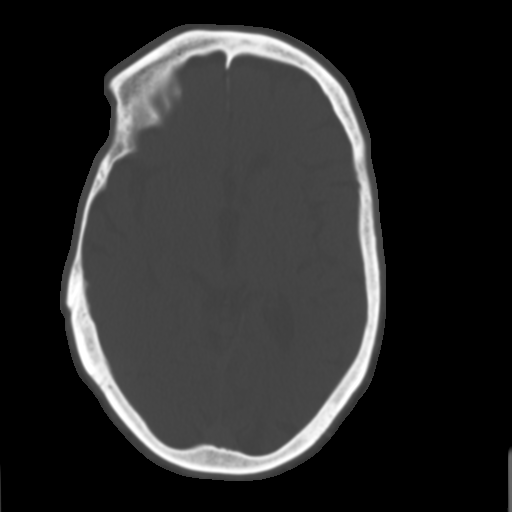
[im 19/34  brain]
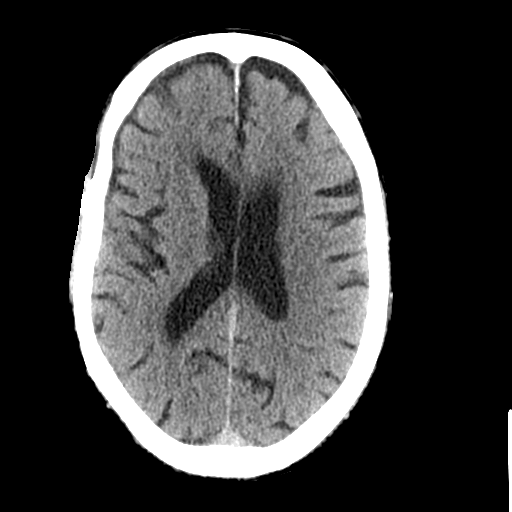
[im 22/34  brain]
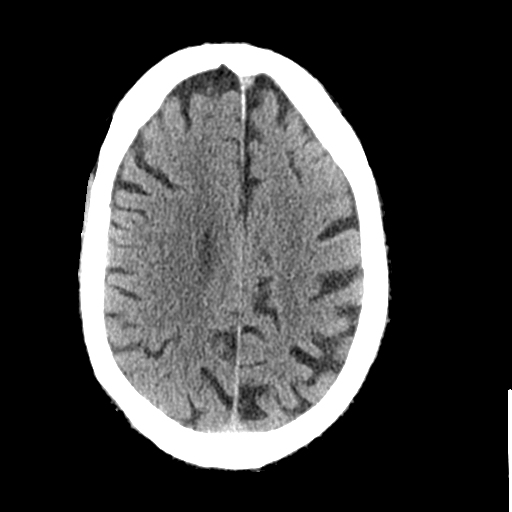
[im 26/34  brain]
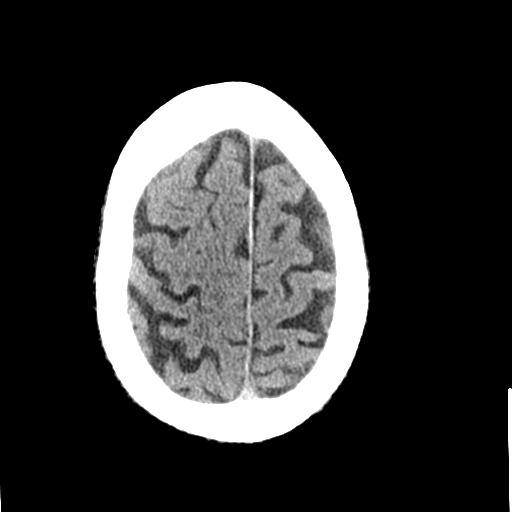
[im 28/34  brain]
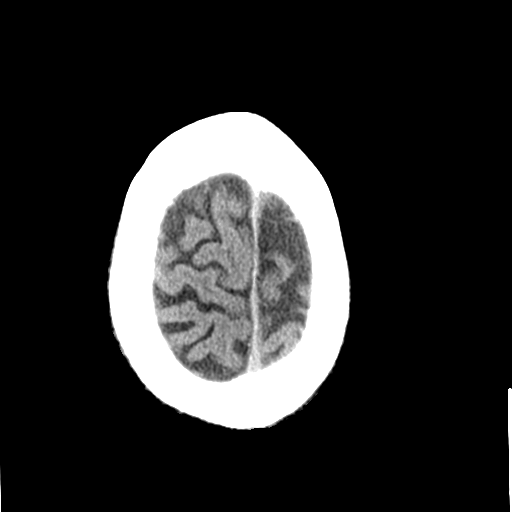
[im 28/34  bone]
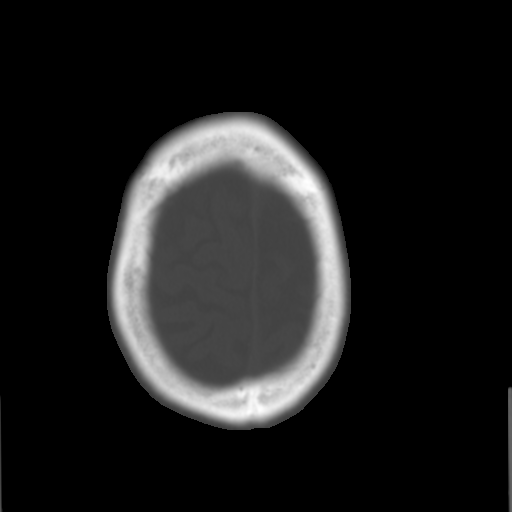
[im 31/34  brain]
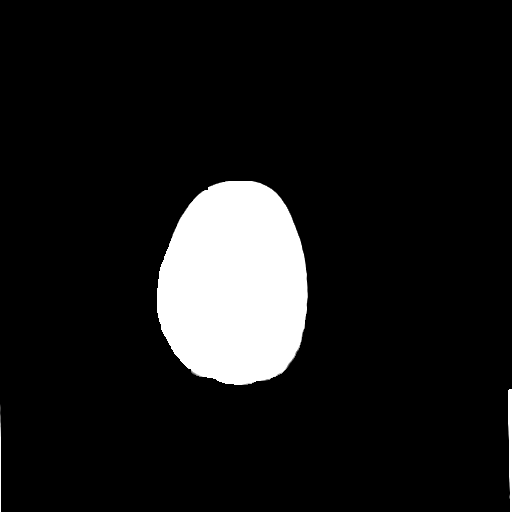

[Series 4: coronal soft · coronal · 0.34mm/px · 3 of 74 slices shown]
[im 25/74  brain]
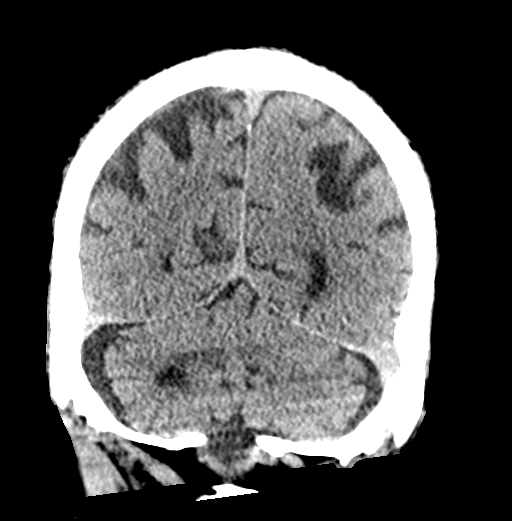
[im 33/74  brain]
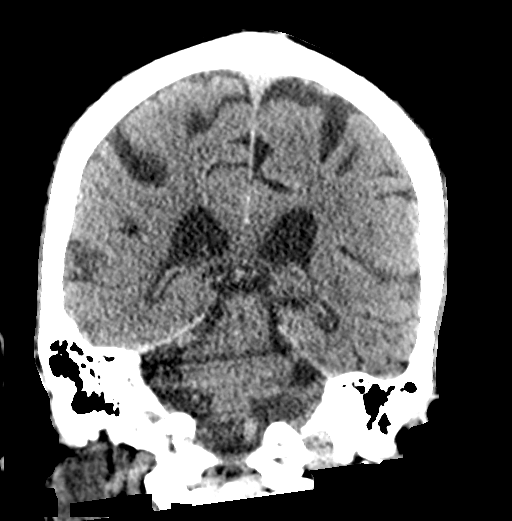
[im 41/74  brain]
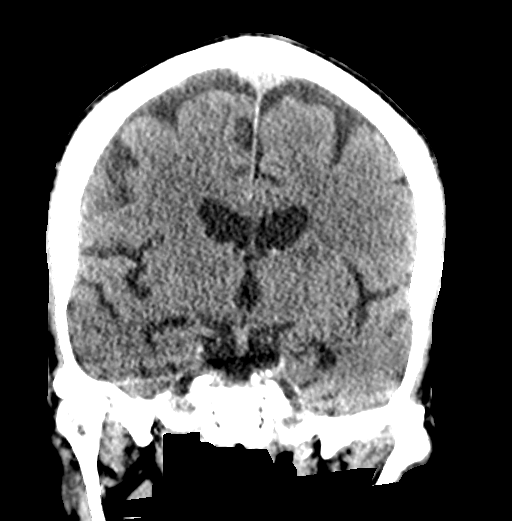

[Series 5: sagittal soft · sagittal · 0.38mm/px · 3 of 55 slices shown]
[im 20/55  brain]
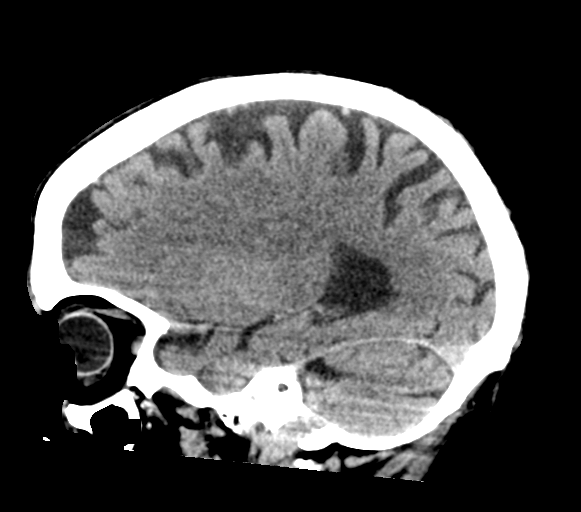
[im 28/55  brain]
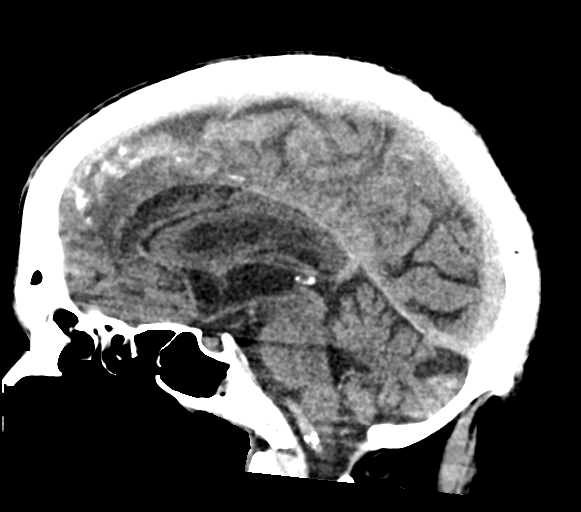
[im 35/55  brain]
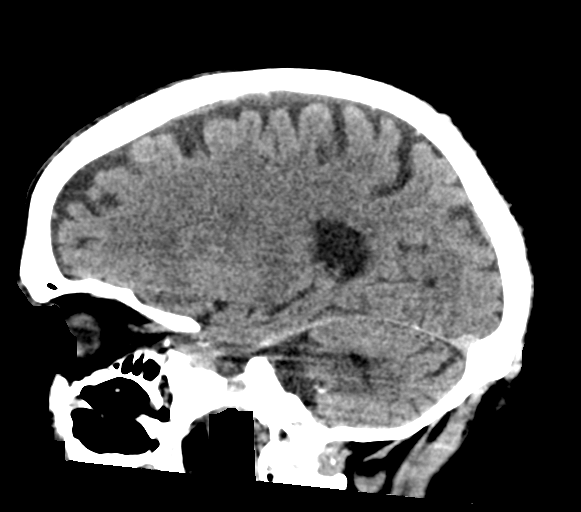

[16 of 47 positions shown; findings below may reference images not displayed]

FINDINGS: Brain: Age related cerebral atrophy and ventriculomegaly. Patchy
periventricular white matter disease. No findings for hemispheric
infarction or intracranial hemorrhage. No mass lesions. Remote
appearing right cerebellar cortical infarct and probable lacunar
type infarct near the middle cerebellar peduncle.

Vascular: Vascular calcifications but no aneurysm or hyperdense
vessels.

Skull: No skull fracture. Rounded osseous density near the left
frontal sinus is most likely a benign osteoma.

Sinuses/Orbits: The paranasal sinuses and mastoid air cells are
clear. Globes are intact.

Other: No scalp lesions or scalp hematoma.
IMPRESSION: 1. Age related cerebral atrophy and ventriculomegaly.
2. Patchy periventricular white matter disease.
3. Remote appearing right cerebellar infarcts.
4. No acute intracranial findings or mass lesions.

## 2023-07-15 ENCOUNTER — Ambulatory Visit: Payer: TRICARE For Life (TFL) | Admitting: Nurse Practitioner

## 2023-07-15 NOTE — Progress Notes (Unsigned)
   Subjective:    Patient ID: Stephen Cross, male    DOB: 01-04-1941, 82 y.o.   MRN: [id number]   Chief Complaint: medical management of chronic issues     HPI:  Stephen RICHCREEK is a 44 y.o. who identifies as a male who was assigned male at birth.   Social history: Lives with: wife Work history: retired from [employer] in today for follow up of the following chronic medical issues:  1. Essential hypertension No c/o chest pain, sob or headache. Doe snot check blood pressure at home. BP Readings from Last 3 Encounters:  01/14/23 132/65  08/31/22 105/69  07/09/22 129/70     2. Chronic coronary artery disease Doe snot see cardiology  3. Mixed hyperlipidemia Does not watch diet and does no dedicated exercise. Lab Results  Component Value Date   CHOL 149 07/09/2022   HDL 46 07/09/2022   LDLCALC 80 07/09/2022   TRIG 133 07/09/2022   CHOLHDL 3.2 07/09/2022     4. Benign prostatic hyperplasia with nocturia Denies any voiding issues Lab Results  Component Value Date   PSA1 3.8 01/15/2021   PSA1 4.7 (H) 02/25/2020   PSA1 4.7 (H) 08/27/2019       5. Osteopenia of lumbar spine Last dexascan was 07/15/21. T score was -1.9   New complaints: None today  Allergies  Allergen Reactions   Codeine Nausea Only and Rash   Lisinopril Cough and Other (See Comments)   Outpatient Encounter Medications as of 07/15/2023  Medication Sig   amLODipine (NORVASC) 10 MG tablet Take 1 tablet (10 mg total) by mouth daily.   clopidogrel (PLAVIX) 75 MG tablet Take 1 tablet (75 mg total) by mouth daily.   finasteride (PROSCAR) 5 MG tablet TAKE ONE (1) TABLET BY MOUTH EVERY DAY   meclizine (ANTIVERT) 25 MG tablet Take 1 tablet (25 mg total) by mouth 3 (three) times daily as needed for dizziness.   pravastatin (PRAVACHOL) 80 MG tablet Take 1 tablet (80 mg total) by mouth daily.   No facility-administered encounter medications on file as of 07/15/2023.    Past Surgical History:   Procedure Laterality Date   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; Surgeon: Donette Larry, MD; Location: [facility] MAIN OR; Service: Cardiothoracic; Laterality: N/A; Coronary Artery Bypass Graft x4 Utilizing Left Internal Mammary Artery and Left Saphenous Vein by Open Procedure   HERNIA REPAIR     x 2    Family History  Problem Relation Age of Onset   Heart failure Mother    Obesity Sister    Heart disease Sister       Controlled substance contract: ***     Review of Systems     Objective:   Physical Exam        Assessment & Plan:

## 2023-07-19 ENCOUNTER — Encounter: Payer: Self-pay | Admitting: Nurse Practitioner

## 2023-08-05 ENCOUNTER — Other Ambulatory Visit: Payer: Self-pay | Admitting: Nurse Practitioner

## 2023-08-05 DIAGNOSIS — I10 Essential (primary) hypertension: Secondary | ICD-10-CM

## 2023-08-15 ENCOUNTER — Ambulatory Visit (INDEPENDENT_AMBULATORY_CARE_PROVIDER_SITE_OTHER): Payer: Medicare Other | Admitting: Nurse Practitioner

## 2023-08-15 ENCOUNTER — Encounter: Payer: Self-pay | Admitting: Nurse Practitioner

## 2023-08-15 VITALS — BP 145/70 | HR 90 | Temp 98.2°F | Ht 73.0 in | Wt 141.0 lb

## 2023-08-15 DIAGNOSIS — N401 Enlarged prostate with lower urinary tract symptoms: Secondary | ICD-10-CM | POA: Diagnosis not present

## 2023-08-15 DIAGNOSIS — E782 Mixed hyperlipidemia: Secondary | ICD-10-CM | POA: Diagnosis not present

## 2023-08-15 DIAGNOSIS — I2510 Atherosclerotic heart disease of native coronary artery without angina pectoris: Secondary | ICD-10-CM

## 2023-08-15 DIAGNOSIS — I10 Essential (primary) hypertension: Secondary | ICD-10-CM | POA: Diagnosis not present

## 2023-08-15 DIAGNOSIS — Z8673 Personal history of transient ischemic attack (TIA), and cerebral infarction without residual deficits: Secondary | ICD-10-CM

## 2023-08-15 DIAGNOSIS — M8588 Other specified disorders of bone density and structure, other site: Secondary | ICD-10-CM

## 2023-08-15 DIAGNOSIS — R351 Nocturia: Secondary | ICD-10-CM

## 2023-08-15 LAB — LIPID PANEL

## 2023-08-15 MED ORDER — PRAVASTATIN SODIUM 80 MG PO TABS: 80.0000 mg | ORAL_TABLET | Freq: Every day | ORAL | 1 refills | Status: DC

## 2023-08-15 MED ORDER — CLOPIDOGREL BISULFATE 75 MG PO TABS: 75.0000 mg | ORAL_TABLET | Freq: Every day | ORAL | 1 refills | Status: DC

## 2023-08-15 MED ORDER — AMLODIPINE BESYLATE 10 MG PO TABS: 10.0000 mg | ORAL_TABLET | Freq: Every day | ORAL | 1 refills | Status: DC

## 2023-08-15 NOTE — Progress Notes (Signed)
 Subjective:    Patient ID: Stephen Cross, male    DOB: 06/09/1941, 83 y.o.   MRN: [id number]   Chief Complaint: Medical Management of Chronic Issues    HPI:  Stephen Cross is a 83 y.o. who identifies as a male who was assigned male at birth.   Social history: Lives with: wife Work history: retired from [employer] in today for follow up of the following chronic medical issues:  1. Essential hypertension Denies chest pain, sob, or headaches. Rarely checks BP at home. BP Readings from Last 3 Encounters:  08/15/23 (!) 145/70  01/14/23 132/65  08/31/22 105/69    2. Chronic coronary artery disease Has not seen cardiology in a while; does not have a f/u scheduled and does not want to unless he has issues.  3. Mixed hyperlipidemia Tries to watch his diet; stays active. Lab Results  Component Value Date   CHOL 149 07/09/2022   HDL 46 07/09/2022   LDLCALC 80 07/09/2022   TRIG 133 07/09/2022   CHOLHDL 3.2 07/09/2022    4. Osteopenia of lumbar spine Last DEXA done 07/16/21 with t-score of -1.9; occasionally does weight bearing exercises.  5. Benign prostatic hyperplasia with nocturia States he does well on Proscar . Denies any hematuria since he saw urology in December 2023. Had CT chest/abdomen/pelvis in March; has not seen urology since; needs to f/u with urology.   New complaints: None   Allergies  Allergen Reactions   Codeine Nausea Only and Rash   Lisinopril Cough and Other (See Comments)   Outpatient Encounter Medications as of 08/15/2023  Medication Sig   amLODipine  (NORVASC ) 10 MG tablet TAKE ONE (1) TABLET BY MOUTH EVERY DAY   clopidogrel  (PLAVIX ) 75 MG tablet Take 1 tablet (75 mg total) by mouth daily.   finasteride  (PROSCAR ) 5 MG tablet TAKE ONE (1) TABLET BY MOUTH EVERY DAY   meclizine  (ANTIVERT ) 25 MG tablet Take 1 tablet (25 mg total) by mouth 3 (three) times daily as needed for dizziness.   pravastatin  (PRAVACHOL ) 80 MG tablet Take 1 tablet (80 mg  total) by mouth daily.   No facility-administered encounter medications on file as of 08/15/2023.    Past Surgical History:  Procedure Laterality Date   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; Surgeon: Rosalynn JONETTA Dines, MD; Location[facility] MAIN [facility]; Service: Cardiothoracic; Laterality: N/A; Coronary Artery Bypass Graft x4 Utilizing Left Internal Mammary Artery and Left Saphenous Vein by Open Procedure   HERNIA REPAIR     x 2    Family History  Problem Relation Age of Onset   Heart failure Mother    Obesity Sister    Heart disease Sister       Controlled substance contract: n/a     Review of Systems  Constitutional:  Negative for activity change, appetite change, fatigue and unexpected weight change.  HENT:  Negative for ear pain and sinus pressure.   Eyes:  Negative for pain.  Respiratory:  Negative for cough, chest tightness and shortness of breath.   Cardiovascular:  Negative for chest pain, palpitations and leg swelling.  Gastrointestinal:  Negative for abdominal pain and blood in stool.  Endocrine: Negative for cold intolerance, heat intolerance, polydipsia and polyphagia.  Genitourinary:  Negative for decreased urine volume, dysuria and hematuria.  Musculoskeletal:  Negative for arthralgias, joint swelling and myalgias.  Skin:  Negative for color change and rash.  Neurological:  Negative for  syncope, weakness and headaches.  Hematological:  Negative for adenopathy.  All other systems reviewed and are negative.      Objective:   Physical Exam Vitals and nursing note reviewed.  Constitutional:      General: He is not in acute distress.    Appearance: Normal appearance. He is not ill-appearing.  HENT:     Head: Normocephalic and atraumatic.     Right Ear: Tympanic membrane normal.     Left Ear: Tympanic membrane normal.     Nose: Nose normal.     Mouth/Throat:     Mouth: Mucous membranes are moist.     Pharynx: Oropharynx  is clear.  Eyes:     Conjunctiva/sclera: Conjunctivae normal.     Pupils: Pupils are equal, round, and reactive to light.  Cardiovascular:     Rate and Rhythm: Normal rate and regular rhythm.     Pulses: Normal pulses.     Heart sounds: Normal heart sounds.  Pulmonary:     Effort: Pulmonary effort is normal. No respiratory distress.     Breath sounds: Normal breath sounds. No wheezing, rhonchi or rales.  Abdominal:     General: Abdomen is flat. Bowel sounds are normal. There is no distension.     Palpations: Abdomen is soft. There is no mass.     Tenderness: There is no abdominal tenderness.     Hernia: No hernia is present.  Musculoskeletal:        General: Normal range of motion.     Cervical back: Normal range of motion. No rigidity or tenderness.     Right lower leg: No edema.     Left lower leg: No edema.  Lymphadenopathy:     Cervical: No cervical adenopathy.  Skin:    General: Skin is warm and dry.     Capillary Refill: Capillary refill takes 2 to 3 seconds.  Neurological:     General: No focal deficit present.     Mental Status: He is alert and oriented to person, place, and time.  Psychiatric:        Mood and Affect: Mood normal.        Behavior: Behavior normal.      BP (!) 145/70   Pulse 90   Temp 98.2 F (36.8 C) (Temporal)   Ht 6' 1 (1.854 m)   Wt 141 lb (64 kg)   SpO2 99%   BMI 18.60 kg/m       Assessment & Plan:   Stephen Cross comes in today with chief complaint of Medical Management of Chronic Issues   Diagnosis and orders addressed:  1. Essential hypertension Low sodium diet - CBC with Differential/Platelet - CMP14+EGFR - amLODipine  (NORVASC ) 10 MG tablet; Take 1 tablet (10 mg total) by mouth daily.  Dispense: 90 tablet; Refill: 1  2. Chronic coronary artery disease Keep follow up with cardiology  3. Mixed hyperlipidemia Low fat diet - Lipid panel - pravastatin  (PRAVACHOL ) 80 MG tablet; Take 1 tablet (80 mg total) by mouth daily.   Dispense: 90 tablet; Refill: 1  4. Osteopenia of lumbar spine Weight bearing exercise  5. Benign prostatic hyperplasia with nocturia Report any voiding issues  6. Lung nodule - CT Chest W Contrast; Future  7. History of stroke - clopidogrel  (PLAVIX ) 75 MG tablet; Take 1 tablet (75 mg total) by mouth daily.  Dispense: 90 tablet; Refill: 1   Labs pending Health Maintenance reviewed Diet and exercise encouraged  Follow up plan: 6 months  Mary-Margaret Gladis, FNP

## 2023-08-15 NOTE — Patient Instructions (Signed)
 Fall Prevention in the Home, Adult Falls can cause injuries and can happen to people of all ages. There are many things you can do to make your home safer and to help prevent falls. What actions can I take to prevent falls? General information Use good lighting in all rooms. Make sure to: Replace any light bulbs that burn out. Turn on the lights in dark areas and use night-lights. Keep items that you use often in easy-to-reach places. Lower the shelves around your home if needed. Move furniture so that there are clear paths around it. Do not use throw rugs or other things on the floor that can make you trip. If any of your floors are uneven, fix them. Add color or contrast paint or tape to clearly mark and help you see: Grab bars or handrails. First and last steps of staircases. Where the edge of each step is. If you use a ladder or stepladder: Make sure that it is fully opened. Do not climb a closed ladder. Make sure the sides of the ladder are locked in place. Have someone hold the ladder while you use it. Know where your pets are as you move through your home. What can I do in the bathroom?     Keep the floor dry. Clean up any water on the floor right away. Remove soap buildup in the bathtub or shower. Buildup makes bathtubs and showers slippery. Use non-skid mats or decals on the floor of the bathtub or shower. Attach bath mats securely with double-sided, non-slip rug tape. If you need to sit down in the shower, use a non-slip stool. Install grab bars by the toilet and in the bathtub and shower. Do not use towel bars as grab bars. What can I do in the bedroom? Make sure that you have a light by your bed that is easy to reach. Do not use any sheets or blankets on your bed that hang to the floor. Have a firm chair or bench with side arms that you can use for support when you get dressed. What can I do in the kitchen? Clean up any spills right away. If you need to reach something  above you, use a step stool with a grab bar. Keep electrical cords out of the way. Do not use floor polish or wax that makes floors slippery. What can I do with my stairs? Do not leave anything on the stairs. Make sure that you have a light switch at the top and the bottom of the stairs. Make sure that there are handrails on both sides of the stairs. Fix handrails that are broken or loose. Install non-slip stair treads on all your stairs if they do not have carpet. Avoid having throw rugs at the top or bottom of the stairs. Choose a carpet that does not hide the edge of the steps on the stairs. Make sure that the carpet is firmly attached to the stairs. Fix carpet that is loose or worn. What can I do on the outside of my home? Use bright outdoor lighting. Fix the edges of walkways and driveways and fix any cracks. Clear paths of anything that can make you trip, such as tools or rocks. Add color or contrast paint or tape to clearly mark and help you see anything that might make you trip as you walk through a door, such as a raised step or threshold. Trim any bushes or trees on paths to your home. Check to see if handrails are loose  or broken and that both sides of all steps have handrails. Install guardrails along the edges of any raised decks and porches. Have leaves, snow, or ice cleared regularly. Use sand, salt, or ice melter on paths if you live where there is ice and snow during the winter. Clean up any spills in your garage right away. This includes grease or oil spills. What other actions can I take? Review your medicines with your doctor. Some medicines can cause dizziness or changes in blood pressure, which increase your risk of falling. Wear shoes that: Have a low heel. Do not wear high heels. Have rubber bottoms and are closed at the toe. Feel good on your feet and fit well. Use tools that help you move around if needed. These include: Canes. Walkers. Scooters. Crutches. Ask  your doctor what else you can do to help prevent falls. This may include seeing a physical therapist to learn to do exercises to move better and get stronger. Where to find more information Centers for Disease Control and Prevention, [employer]: TonerPromos.no [employer] on Aging: BaseRingTones.pl National Institute on Aging: BaseRingTones.pl Contact a doctor if: You are afraid of falling at home. You feel weak, drowsy, or dizzy at home. You fall at home. Get help right away if you: Lose consciousness or have trouble moving after a fall. Have a fall that causes a head injury. These symptoms may be an emergency. Get help right away. Call 911. Do not wait to see if the symptoms will go away. Do not drive yourself to the hospital. This information is not intended to replace advice given to you by your health care provider. Make sure you discuss any questions you have with your health care provider. Document Revised: 03/22/2022 Document Reviewed: 03/22/2022 Elsevier Patient Education  2024 [employer].

## 2023-08-16 ENCOUNTER — Other Ambulatory Visit: Payer: Self-pay

## 2023-08-16 LAB — CBC WITH DIFFERENTIAL/PLATELET
Basophils Absolute: 0.1 10*3/uL (ref 0.0–0.2)
Basos: 1 %
EOS (ABSOLUTE): 0.6 10*3/uL — ABNORMAL HIGH (ref 0.0–0.4)
Eos: 7 %
Hematocrit: 32.8 % — ABNORMAL LOW (ref 37.5–51.0)
Hemoglobin: 9.2 g/dL — ABNORMAL LOW (ref 13.0–17.7)
Immature Grans (Abs): 0.1 10*3/uL (ref 0.0–0.1)
Immature Granulocytes: 1 %
Lymphocytes Absolute: 1.5 10*3/uL (ref 0.7–3.1)
Lymphs: 18 %
MCH: 18.8 pg — ABNORMAL LOW (ref 26.6–33.0)
MCHC: 28 g/dL — ABNORMAL LOW (ref 31.5–35.7)
MCV: 67 fL — ABNORMAL LOW (ref 79–97)
Monocytes Absolute: 0.7 10*3/uL (ref 0.1–0.9)
Monocytes: 8 %
Neutrophils Absolute: 5.8 10*3/uL (ref 1.4–7.0)
Neutrophils: 65 %
Platelets: 345 10*3/uL (ref 150–450)
RBC: 4.89 x10E6/uL (ref 4.14–5.80)
RDW: 16.1 % — ABNORMAL HIGH (ref 11.6–15.4)
WBC: 8.8 10*3/uL (ref 3.4–10.8)

## 2023-08-16 LAB — CMP14+EGFR
ALT: 8 [IU]/L (ref 0–44)
AST: 13 [IU]/L (ref 0–40)
Albumin: 4.6 g/dL (ref 3.7–4.7)
Alkaline Phosphatase: 83 [IU]/L (ref 44–121)
BUN/Creatinine Ratio: 18 (ref 10–24)
BUN: 20 mg/dL (ref 8–27)
Bilirubin Total: 0.4 mg/dL (ref 0.0–1.2)
CO2: 18 mmol/L — ABNORMAL LOW (ref 20–29)
Calcium: 9.5 mg/dL (ref 8.6–10.2)
Chloride: 107 mmol/L — ABNORMAL HIGH (ref 96–106)
Creatinine, Ser: 1.11 mg/dL (ref 0.76–1.27)
Globulin, Total: 2 g/dL (ref 1.5–4.5)
Glucose: 100 mg/dL — ABNORMAL HIGH (ref 70–99)
Potassium: 4.4 mmol/L (ref 3.5–5.2)
Sodium: 144 mmol/L (ref 134–144)
Total Protein: 6.6 g/dL (ref 6.0–8.5)
eGFR: 66 mL/min/{1.73_m2}

## 2023-08-16 LAB — LIPID PANEL
Cholesterol, Total: 111 mg/dL (ref 100–199)
HDL: 48 mg/dL (ref >39)
LDL CALC COMMENT:: 2.3 ratio (ref 0.0–5.0)
LDL Chol Calc (NIH): 46 mg/dL (ref 0–99)
Triglycerides: 86 mg/dL (ref 0–149)
VLDL Cholesterol Cal: 17 mg/dL (ref 5–40)

## 2023-10-21 ENCOUNTER — Other Ambulatory Visit (HOSPITAL_COMMUNITY): Payer: Self-pay | Admitting: Orthopedic Surgery

## 2023-10-21 DIAGNOSIS — M545 Low back pain, unspecified: Secondary | ICD-10-CM

## 2023-10-21 DIAGNOSIS — M5416 Radiculopathy, lumbar region: Secondary | ICD-10-CM

## 2023-10-21 DIAGNOSIS — M25551 Pain in right hip: Secondary | ICD-10-CM

## 2023-11-01 ENCOUNTER — Ambulatory Visit (HOSPITAL_COMMUNITY)
Admission: RE | Admit: 2023-11-01 | Discharge: 2023-11-01 | Disposition: A | Discharge status: Home / Self Care | Source: Ambulatory Visit | Attending: Orthopedic Surgery | Admitting: Orthopedic Surgery

## 2023-11-01 DIAGNOSIS — M25551 Pain in right hip: Secondary | ICD-10-CM | POA: Insufficient documentation

## 2023-11-01 DIAGNOSIS — M5416 Radiculopathy, lumbar region: Secondary | ICD-10-CM

## 2023-11-01 DIAGNOSIS — M545 Low back pain, unspecified: Secondary | ICD-10-CM | POA: Diagnosis present

## 2023-11-04 ENCOUNTER — Other Ambulatory Visit (HOSPITAL_COMMUNITY)

## 2024-02-06 ENCOUNTER — Other Ambulatory Visit: Payer: Self-pay | Admitting: Urology

## 2024-02-06 DIAGNOSIS — N401 Enlarged prostate with lower urinary tract symptoms: Secondary | ICD-10-CM

## 2024-02-10 ENCOUNTER — Encounter: Payer: Self-pay | Admitting: Nurse Practitioner

## 2024-02-10 ENCOUNTER — Ambulatory Visit (INDEPENDENT_AMBULATORY_CARE_PROVIDER_SITE_OTHER): Payer: Medicare Other | Admitting: Nurse Practitioner

## 2024-02-10 VITALS — BP 131/56 | HR 83 | Temp 97.2°F | Ht 73.0 in | Wt 133.0 lb

## 2024-02-10 DIAGNOSIS — E782 Mixed hyperlipidemia: Secondary | ICD-10-CM | POA: Diagnosis not present

## 2024-02-10 DIAGNOSIS — N138 Other obstructive and reflux uropathy: Secondary | ICD-10-CM

## 2024-02-10 DIAGNOSIS — I10 Essential (primary) hypertension: Secondary | ICD-10-CM

## 2024-02-10 DIAGNOSIS — M8588 Other specified disorders of bone density and structure, other site: Secondary | ICD-10-CM | POA: Diagnosis not present

## 2024-02-10 DIAGNOSIS — R911 Solitary pulmonary nodule: Secondary | ICD-10-CM

## 2024-02-10 DIAGNOSIS — R351 Nocturia: Secondary | ICD-10-CM

## 2024-02-10 DIAGNOSIS — Z8673 Personal history of transient ischemic attack (TIA), and cerebral infarction without residual deficits: Secondary | ICD-10-CM

## 2024-02-10 DIAGNOSIS — I2510 Atherosclerotic heart disease of native coronary artery without angina pectoris: Secondary | ICD-10-CM | POA: Diagnosis not present

## 2024-02-10 DIAGNOSIS — N401 Enlarged prostate with lower urinary tract symptoms: Secondary | ICD-10-CM

## 2024-02-10 MED ORDER — CLOPIDOGREL BISULFATE 75 MG PO TABS: 75.0000 mg | ORAL_TABLET | Freq: Every day | ORAL | 1 refills | Status: DC

## 2024-02-10 MED ORDER — FINASTERIDE 5 MG PO TABS: 5.0000 mg | ORAL_TABLET | Freq: Every day | ORAL | 1 refills | Status: DC

## 2024-02-10 MED ORDER — PRAVASTATIN SODIUM 80 MG PO TABS: 80.0000 mg | ORAL_TABLET | Freq: Every day | ORAL | 1 refills | Status: DC

## 2024-02-10 MED ORDER — AMLODIPINE BESYLATE 10 MG PO TABS: 10.0000 mg | ORAL_TABLET | Freq: Every day | ORAL | 1 refills | Status: DC

## 2024-02-10 NOTE — Patient Instructions (Signed)
 Fall Prevention in the Home, Adult Falls can cause injuries and can happen to people of all ages. There are many things you can do to make your home safer and to help prevent falls. What actions can I take to prevent falls? General information Use good lighting in all rooms. Make sure to: Replace any light bulbs that burn out. Turn on the lights in dark areas and use night-lights. Keep items that you use often in easy-to-reach places. Lower the shelves around your home if needed. Move furniture so that there are clear paths around it. Do not use throw rugs or other things on the floor that can make you trip. If any of your floors are uneven, fix them. Add color or contrast paint or tape to clearly mark and help you see: Grab bars or handrails. First and last steps of staircases. Where the edge of each step is. If you use a ladder or stepladder: Make sure that it is fully opened. Do not climb a closed ladder. Make sure the sides of the ladder are locked in place. Have someone hold the ladder while you use it. Know where your pets are as you move through your home. What can I do in the bathroom?     Keep the floor dry. Clean up any water on the floor right away. Remove soap buildup in the bathtub or shower. Buildup makes bathtubs and showers slippery. Use non-skid mats or decals on the floor of the bathtub or shower. Attach bath mats securely with double-sided, non-slip rug tape. If you need to sit down in the shower, use a non-slip stool. Install grab bars by the toilet and in the bathtub and shower. Do not use towel bars as grab bars. What can I do in the bedroom? Make sure that you have a light by your bed that is easy to reach. Do not use any sheets or blankets on your bed that hang to the floor. Have a firm chair or bench with side arms that you can use for support when you get dressed. What can I do in the kitchen? Clean up any spills right away. If you need to reach something  above you, use a step stool with a grab bar. Keep electrical cords out of the way. Do not use floor polish or wax that makes floors slippery. What can I do with my stairs? Do not leave anything on the stairs. Make sure that you have a light switch at the top and the bottom of the stairs. Make sure that there are handrails on both sides of the stairs. Fix handrails that are broken or loose. Install non-slip stair treads on all your stairs if they do not have carpet. Avoid having throw rugs at the top or bottom of the stairs. Choose a carpet that does not hide the edge of the steps on the stairs. Make sure that the carpet is firmly attached to the stairs. Fix carpet that is loose or worn. What can I do on the outside of my home? Use bright outdoor lighting. Fix the edges of walkways and driveways and fix any cracks. Clear paths of anything that can make you trip, such as tools or rocks. Add color or contrast paint or tape to clearly mark and help you see anything that might make you trip as you walk through a door, such as a raised step or threshold. Trim any bushes or trees on paths to your home. Check to see if handrails are loose  or broken and that both sides of all steps have handrails. Install guardrails along the edges of any raised decks and porches. Have leaves, snow, or ice cleared regularly. Use sand, salt, or ice melter on paths if you live where there is ice and snow during the winter. Clean up any spills in your garage right away. This includes grease or oil spills. What other actions can I take? Review your medicines with your doctor. Some medicines can cause dizziness or changes in blood pressure, which increase your risk of falling. Wear shoes that: Have a low heel. Do not wear high heels. Have rubber bottoms and are closed at the toe. Feel good on your feet and fit well. Use tools that help you move around if needed. These include: Canes. Walkers. Scooters. Crutches. Ask  your doctor what else you can do to help prevent falls. This may include seeing a physical therapist to learn to do exercises to move better and get stronger. Where to find more information Centers for Disease Control and Prevention, [employer]: TonerPromos.no [employer] on Aging: BaseRingTones.pl National Institute on Aging: BaseRingTones.pl Contact a doctor if: You are afraid of falling at home. You feel weak, drowsy, or dizzy at home. You fall at home. Get help right away if you: Lose consciousness or have trouble moving after a fall. Have a fall that causes a head injury. These symptoms may be an emergency. Get help right away. Call 911. Do not wait to see if the symptoms will go away. Do not drive yourself to the hospital. This information is not intended to replace advice given to you by your health care provider. Make sure you discuss any questions you have with your health care provider. Document Revised: 03/22/2022 Document Reviewed: 03/22/2022 Elsevier Patient Education  2024 [employer].

## 2024-02-10 NOTE — Progress Notes (Signed)
 Subjective:    Patient ID: Stephen Cross, male    DOB: [birth year], 83 y.o.   MRN: [id number]   Chief Complaint: medical management of chronic issues     HPI:  Stephen Cross is a 4 y.o. who identifies as a male who was assigned male at birth.   Social history: Lives with: wife Work history: retired from [employer] in today for follow up of the following chronic medical issues:  1. Essential hypertension Denies chest pain, sob, or headaches. Rarely checks BP at home. BP Readings from Last 3 Encounters:  08/15/23 (!) 145/70  01/14/23 132/65  08/31/22 105/69    2. Chronic coronary artery disease Has not seen cardiology in a while; does not have a f/u scheduled and does not want to unless he has issues.  3. Mixed hyperlipidemia Tries to watch his diet; stays active. Lab Results  Component Value Date   CHOL 111 08/15/2023   HDL 48 08/15/2023   LDLCALC 46 08/15/2023   TRIG 86 08/15/2023   CHOLHDL 2.3 08/15/2023    4. Osteopenia of lumbar spine Last DEXA done 07/16/21 with t-score of -1.9; occasionally does weight bearing exercises.  5. Benign prostatic hyperplasia with nocturia States he does well on Proscar . Denies any hematuria since he saw urology in December 2023. Has not follow ed up with urology. He says they never do anything to help so he does not want to see them.  New complaints: Had ct of abdomen in 2024- showed possible lung malignancy- needs to have ct scan of lungs. Denies sob or trouble breathing. Chest ct was ordered but they never could reach patient to schedule so order was closed. Wt Readings from Last 3 Encounters:  02/10/24 133 lb (60.3 kg)  08/15/23 141 lb (64 kg)  01/14/23 135 lb (61.2 kg)    Allergies  Allergen Reactions   Codeine Nausea Only and Rash   Lisinopril Cough and Other (See Comments)   Outpatient Encounter Medications as of 02/10/2024  Medication Sig   amLODipine  (NORVASC ) 10 MG tablet Take 1 tablet (10 mg total) by mouth  daily.   clopidogrel  (PLAVIX ) 75 MG tablet Take 1 tablet (75 mg total) by mouth daily.   finasteride  (PROSCAR ) 5 MG tablet TAKE ONE (1) TABLET BY MOUTH EVERY DAY   meclizine  (ANTIVERT ) 25 MG tablet Take 1 tablet (25 mg total) by mouth 3 (three) times daily as needed for dizziness.   pravastatin  (PRAVACHOL ) 80 MG tablet Take 1 tablet (80 mg total) by mouth daily.   No facility-administered encounter medications on file as of 02/10/2024.    Past Surgical History:  Procedure Laterality Date   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; Surgeon: Rosalynn JONETTA Dines, MD; Location: [facility] MAIN OR; Service: Cardiothoracic; Laterality: N/A; Coronary Artery Bypass Graft x4 Utilizing Left Internal Mammary Artery and Left Saphenous Vein by Open Procedure   HERNIA REPAIR     x 2    Family History  Problem Relation Age of Onset   Heart failure Mother    Obesity Sister    Heart disease Sister       Controlled substance contract: n/a     Review of Systems  Constitutional:  Negative for activity change, appetite change, fatigue and unexpected weight change.  HENT:  Negative for ear pain and sinus pressure.   Eyes:  Negative for pain.  Respiratory:  Negative for cough, chest tightness and shortness  of breath.   Cardiovascular:  Negative for chest pain, palpitations and leg swelling.  Gastrointestinal:  Negative for abdominal pain and blood in stool.  Endocrine: Negative for cold intolerance, heat intolerance, polydipsia and polyphagia.  Genitourinary:  Negative for decreased urine volume, dysuria and hematuria.  Musculoskeletal:  Negative for arthralgias, joint swelling and myalgias.  Skin:  Negative for color change and rash.  Neurological:  Negative for syncope, weakness and headaches.  Hematological:  Negative for adenopathy.  All other systems reviewed and are negative.      Objective:   Physical Exam Vitals and nursing note reviewed.   Constitutional:      General: He is not in acute distress.    Appearance: Normal appearance. He is not ill-appearing.  HENT:     Head: Normocephalic and atraumatic.     Right Ear: Tympanic membrane normal.     Left Ear: Tympanic membrane normal.     Nose: Nose normal.     Mouth/Throat:     Mouth: Mucous membranes are moist.     Pharynx: Oropharynx is clear.  Eyes:     Conjunctiva/sclera: Conjunctivae normal.     Pupils: Pupils are equal, round, and reactive to light.  Cardiovascular:     Rate and Rhythm: Normal rate and regular rhythm.     Pulses: Normal pulses.     Heart sounds: Normal heart sounds.  Pulmonary:     Effort: Pulmonary effort is normal. No respiratory distress.     Breath sounds: Normal breath sounds. No wheezing, rhonchi or rales.  Abdominal:     General: Abdomen is flat. Bowel sounds are normal. There is no distension.     Palpations: Abdomen is soft. There is no mass.     Tenderness: There is no abdominal tenderness.     Hernia: No hernia is present.  Musculoskeletal:        General: Normal range of motion.     Cervical back: Normal range of motion. No rigidity or tenderness.     Right lower leg: No edema.     Left lower leg: No edema.  Lymphadenopathy:     Cervical: No cervical adenopathy.  Skin:    General: Skin is warm and dry.     Capillary Refill: Capillary refill takes 2 to 3 seconds.  Neurological:     General: No focal deficit present.     Mental Status: He is alert and oriented to person, place, and time.  Psychiatric:        Mood and Affect: Mood normal.        Behavior: Behavior normal.      BP (!) 131/56   Pulse 83   Temp (!) 97.2 F (36.2 C) (Temporal)   Ht 6' 1 (1.854 m)   Wt 133 lb (60.3 kg)   SpO2 99%   BMI 17.55 kg/m        Assessment & Plan:   [name] comes in today with chief complaint of medical management of chronic issues    Diagnosis and orders addressed:  1. Essential hypertension Low sodium diet -  CBC with Differential/Platelet - CMP14+EGFR - amLODipine  (NORVASC ) 10 MG tablet; Take 1 tablet (10 mg total) by mouth daily.  Dispense: 90 tablet; Refill: 1  2. Chronic coronary artery disease Keep follow up with cardiology  3. Mixed hyperlipidemia Low fat diet - Lipid panel - pravastatin  (PRAVACHOL ) 80 MG tablet; Take 1 tablet (80 mg total) by mouth daily.  Dispense: 90 tablet; Refill:  1  4. Osteopenia of lumbar spine Weight bearing exercise Will do dexascan today  5. Benign prostatic hyperplasia with nocturia Report any voiding issues  6. History of stroke - clopidogrel  (PLAVIX ) 75 MG tablet; Take 1 tablet (75 mg total) by mouth daily.  Dispense: 90 tablet; Refill: 1  7. Lung nodules Reordered chest CT Patient was told to listen out for call.  Labs pending Health Maintenance reviewed Diet and exercise encouraged  Follow up plan: 6 months   Mary-Margaret Gladis, FNP

## 2024-02-13 ENCOUNTER — Telehealth: Payer: Self-pay | Admitting: Family Medicine

## 2024-02-13 NOTE — Telephone Encounter (Signed)
 Copied from CRM [id number]. Topic: General - Other >> Feb 13, 2024 11:27 AM Emylou G wrote: Reason for CRM: Wife called.. wasn't sure why we called.. I did ask about this: 08:00 AM - Ct Chest W Contrast [place] CT IMAGING - AP-CT 1 She said patient doesn't know why he needs this appt?  Pls call her back.

## 2024-02-13 NOTE — Telephone Encounter (Signed)
 Patient is still declining CT scan. States that he has been aware of this spot for some time and doesn't want to have the scan done

## 2024-02-13 NOTE — Telephone Encounter (Signed)
 Patient is declining CT scan.  He is aware of the spot on his lung. He says he is not having any symptoms. He does not understand why he needs this test.

## 2024-02-13 NOTE — Telephone Encounter (Signed)
 Ok but I strongly encourage him to have it done. Just because currently having nom symptoms does not mean there is not something going on.

## 2024-03-14 ENCOUNTER — Ambulatory Visit (HOSPITAL_COMMUNITY)

## 2024-04-27 ENCOUNTER — Telehealth: Payer: Self-pay | Admitting: Nurse Practitioner

## 2024-04-27 NOTE — Telephone Encounter (Unsigned)
 Copied from CRM #[id number]. Topic: Referral - Request for Referral >> Apr 27, 2024 10:24 AM Avram MATSU wrote: Did the patient discuss referral with their provider in the last year? Yes (If No - schedule appointment) (If Yes - send message)  Appointment offered? No  Type of order/referral and detailed reason for visit: Chiropractor  Preference of office, provider, location: [facility]   If referral order, have you been seen by this specialty before? No (If Yes, this issue or another issue? When? Where?  Can we respond through MyChart? No

## 2024-04-30 NOTE — Telephone Encounter (Signed)
 Please Review

## 2024-05-02 ENCOUNTER — Ambulatory Visit

## 2024-05-08 ENCOUNTER — Ambulatory Visit

## 2024-05-10 ENCOUNTER — Ambulatory Visit

## 2024-05-10 VITALS — BP 131/56 | HR 83 | Ht 73.0 in | Wt 133.0 lb

## 2024-05-10 DIAGNOSIS — Z0000 Encounter for general adult medical examination without abnormal findings: Secondary | ICD-10-CM | POA: Diagnosis not present

## 2024-05-10 NOTE — Progress Notes (Signed)
 Subjective:   Stephen Cross is a 83 y.o. who presents for a Medicare Wellness preventive visit.  As a reminder, Annual Wellness Visits don't include a physical exam, and some assessments may be limited, especially if this visit is performed virtually. We may recommend an in-person follow-up visit with your provider if needed.  Visit Complete: Virtual I connected with  Stephen Cross on 05/10/24 by a audio enabled telemedicine application and verified that I am speaking with the correct person using two identifiers.  Patient Location: Home  Provider Location: Home Office  I discussed the limitations of evaluation and management by telemedicine. The patient expressed understanding and agreed to proceed.  Vital Signs: Because this visit was a virtual/telehealth visit, some criteria may be missing or patient reported. Any vitals not documented were not able to be obtained and vitals that have been documented are patient reported.  VideoDeclined- This patient declined Librarian, academic. Therefore the visit was completed with audio only.  Persons Participating in Visit: Patient.  AWV Questionnaire: No: Patient Medicare AWV questionnaire was not completed prior to this visit.  Cardiac Risk Factors include: advanced age (>35men, >75 women);dyslipidemia     Objective:    Today's Vitals   05/10/24 1259  BP: (!) 131/56  Pulse: 83  Weight: 133 lb (60.3 kg)  Height: 6' 1 (1.854 m)   Body mass index is 17.55 kg/m.     05/10/2024    1:10 PM 06/30/2022    1:27 PM 06/29/2021    3:36 PM  Advanced Directives  Does Patient Have a Medical Advance Directive? No No No  Would patient like information on creating a medical advance directive?  No - Patient declined No - Patient declined    Current Medications (verified) Outpatient Encounter Medications as of 05/10/2024  Medication Sig   amLODipine  (NORVASC ) 10 MG tablet Take 1 tablet (10 mg total) by mouth daily.    clopidogrel  (PLAVIX ) 75 MG tablet Take 1 tablet (75 mg total) by mouth daily.   finasteride  (PROSCAR ) 5 MG tablet Take 1 tablet (5 mg total) by mouth daily.   meclizine  (ANTIVERT ) 25 MG tablet Take 1 tablet (25 mg total) by mouth 3 (three) times daily as needed for dizziness.   pravastatin  (PRAVACHOL ) 80 MG tablet Take 1 tablet (80 mg total) by mouth daily.   No facility-administered encounter medications on file as of 05/10/2024.    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) 01/13/2012   PEA arrest on induction of stress test   Cardiomyopathy (HCC) 02/21/2012   Hypertension    Osteopenia 07/17/2021   Stroke (HCC)    Syncope, cardiogenic 01/10/2012   Past Surgical History:  Procedure Laterality Date   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; Surgeon: Rosalynn JONETTA Dines, MD; Location: [facility] MAIN OR; Service: Cardiothoracic; Laterality: N/A; Coronary Artery Bypass Graft x4 Utilizing Left Internal Mammary Artery and Left Saphenous Vein by Open Procedure   HERNIA REPAIR     x 2   Family History  Problem Relation Age of Onset   Heart failure Mother    Obesity Sister    Heart disease Sister    Social History   Socioeconomic History   Marital status: Married    Spouse name: patricia   Number of  children: 4   Years of education: Not on file   Highest education level: Not on file  Occupational History   Occupation: retired   Tobacco Use   Smoking status: Former   Smokeless tobacco: Never  Advertising account planner   Vaping status: Never Used  Substance and Sexual Activity   Alcohol use: Never   Drug use: Never   Sexual activity: Not on file  Other Topics Concern   Not on file  Social History Narrative   3 daughters and 1 son   Social Drivers of Corporate investment banker Strain: Low Risk   (05/10/2024)   Overall Financial Resource Strain (CARDIA)    Difficulty of Paying Living Expenses: Not hard at all  Food Insecurity: No Food Insecurity (05/10/2024)   Hunger Vital Sign    Worried About Running Out of Food in the Last Year: Never true    Ran Out of Food in the Last Year: Never true  Transportation Needs: No Transportation Needs (05/10/2024)   PRAPARE - Administrator, Civil Service (Medical): No    Lack of Transportation (Non-Medical): No  Physical Activity: Inactive (05/10/2024)   Exercise Vital Sign    Days of Exercise per Week: 0 days    Minutes of Exercise per Session: 0 min  Stress: No Stress Concern Present (05/10/2024)   [employer] of Occupational Health - Occupational Stress Questionnaire    Feeling of Stress: Not at all  Social Connections: Socially Integrated (05/10/2024)   Social Connection and Isolation Panel    Frequency of Communication with Friends and Family: More than three times a week    Frequency of Social Gatherings with Friends and Family: Three times a week    Attends Religious Services: More than 4 times per year    Active Member of Clubs or Organizations: Yes    Attends Engineer, structural: More than 4 times per year    Marital Status: Married    Tobacco Counseling Counseling given: Yes    Clinical Intake:  Pre-visit preparation completed: Yes  Pain : No/denies pain     BMI - recorded: 17.55 Nutritional Status: BMI <19  Underweight Nutritional Risks: None Diabetes: No  No results found for: HGBA1C   How often do you need to have someone help you when you read instructions, pamphlets, or other written materials from your doctor or pharmacy?: 1 - Never  Interpreter Needed?: No  Information entered by :: alia t/cma   Activities of Daily Living     05/10/2024    1:05 PM  In your present state of health, do you have any difficulty performing the following activities:  Hearing? 0  Vision? 0   Difficulty concentrating or making decisions? 0  Walking or climbing stairs? 1  Dressing or bathing? 0  Doing errands, shopping? 1  Comment pt's Patent attorney and eating ? N  Using the Toilet? N  In the past six months, have you accidently leaked urine? Y  Do you have problems with loss of bowel control? N  Managing your Medications? Y  Comment pt's wife/daughter  Managing your Finances? Y  Comment pt's wife/daughter  Housekeeping or managing your Housekeeping? N    Patient Care Team: Gladis Mustard, FNP as PCP - General (Family Medicine)  I have updated your Care Teams any recent Medical Services you may have received from other providers in the past year.     Assessment:   This is a routine wellness examination  for [employer].  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      Patient Stated   On track    06/30/2022 AWV Goal: Fall Prevention  Over the next year, patient will decrease their risk for falls by: Using assistive devices, such as a cane or walker, as needed Identifying fall risks within their home and correcting them by: Removing throw rugs Adding handrails to stairs or ramps Removing clutter and keeping a clear pathway throughout the home Increasing light, especially at night Adding shower handles/bars Raising toilet seat Identifying potential personal risk factors for falls: Medication side effects Incontinence/urgency Vestibular dysfunction Hearing loss Musculoskeletal disorders Neurological disorders Orthostatic hypotension         Depression Screen     05/10/2024    1:10 PM 02/10/2024    1:49 PM 08/15/2023    2:19 PM 01/14/2023   10:23 AM  07/09/2022    9:43 AM 06/30/2022    1:26 PM 06/21/2022    2:33 PM  PHQ 2/9 Scores  PHQ - 2 Score 0 0 0 0 0 0 0  PHQ- 9 Score    0 0  0    Fall Risk     05/10/2024    1:01 PM 02/10/2024    1:58 PM 02/10/2024    1:49 PM 08/15/2023    2:19 PM 01/14/2023   10:23 AM  Fall Risk   Falls in the past year? 0 0 0 1 0  Number falls in past yr: 0   0   Injury with Fall? 0   0   Risk for fall due to : No Fall Risks   History of fall(s)   Follow up Falls evaluation completed   Education provided     MEDICARE RISK AT HOME:  Medicare Risk at Home Any stairs in or around the home?: No If so, are there any without handrails?: No Home free of loose throw rugs in walkways, pet beds, electrical cords, etc?: Yes Adequate lighting in your home to reduce risk of falls?: Yes Life alert?: No Use of a cane, walker or w/c?: No Grab bars in the bathroom?: Yes Shower chair or bench in shower?: Yes Elevated toilet seat or a handicapped toilet?: No  TIMED UP AND GO:  Was the test performed?  no  Cognitive Function: 6CIT completed        05/10/2024    1:14 PM 06/30/2022    1:36 PM 06/29/2021    3:41 PM  6CIT Screen  What Year? 0 points 0 points 0 points  What month? 0 points 0 points 0 points  What time? 0 points 0 points 0 points  Count back from 20 0 points 0 points 0 points  Months in reverse 0 points 0 points 2 points  Repeat phrase 10 points 2 points 2 points  Total Score 10 points 2 points 4 points    Immunizations Immunization History  Administered Date(s) Administered   Fluad Quad(high Dose 65+) 04/23/2020, 07/16/2021, 07/09/2022   INFLUENZA, HIGH DOSE SEASONAL PF 05/05/2016, 05/03/2017, 05/30/2018   Influenza-Unspecified 05/05/2016, 05/03/2017, 05/30/2018, 04/03/2023   Moderna Sars-Covid-2  Vaccination 08/14/2019, 09/14/2019, 04/23/2020   Pneumococcal Conjugate-13 06/13/2017   Pneumococcal Polysaccharide-23 06/09/2015   Tdap 06/09/2015    Screening Tests Health Maintenance   Topic Date Due   Influenza Vaccine  03/02/2024   COVID-19 Vaccine (4 - 2025-26 season) 04/02/2024   Zoster Vaccines- Shingrix  (1 of 2) 05/12/2024 (Originally [birth year]/08/1989)   Medicare Annual Wellness (AWV)  05/10/2025   DTaP/Tdap/Td (2 - Td or Tdap) 06/08/2025   Pneumococcal Vaccine: 50+ Years  Completed   Meningococcal B Vaccine  Aged Out    Health Maintenance Items Addressed: See Nurse Notes at the end of this note  Additional Screening:  Vision Screening: Recommended annual ophthalmology exams for early detection of glaucoma and other disorders of the eye. Is the patient up to date with their annual eye exam?  Yes  Who is the provider or what is the name of the office in which the patient attends annual eye exams? [facility], [place]  Dental Screening: Recommended annual dental exams for proper oral hygiene  Community Resource Referral / Chronic Care Management: CRR required this visit?  No   CCM required this visit?  No   Plan:    I have personally reviewed and noted the following in the patient's chart:   Medical and social history Use of alcohol, tobacco or illicit drugs  Current medications and supplements including opioid prescriptions. Patient is not currently taking opioid prescriptions. Functional ability and status Nutritional status Physical activity Advanced directives List of other physicians Hospitalizations, surgeries, and ER visits in previous 12 months Vitals Screenings to include cognitive, depression, and falls Referrals and appointments  In addition, I have reviewed and discussed with patient certain preventive protocols, quality metrics, and best practice recommendations. A written personalized care plan for preventive services as well as general preventive health recommendations were provided to patient.   Stephen Cross, CMA   05/10/2024   After Visit Summary: (MyChart) Due to this being a telephonic visit, the after visit summary with patients  personalized plan was offered to patient via MyChart   Notes: Nothing significant to report at this time.

## 2024-05-10 NOTE — Patient Instructions (Signed)
 [name],  Thank you for taking the time for your Medicare Wellness Visit. I appreciate your continued commitment to your health goals. Please review the care plan we discussed, and feel free to reach out if I can assist you further.  Medicare recommends these wellness visits once per year to help you and your care team stay ahead of potential health issues. These visits are designed to focus on prevention, allowing your provider to concentrate on managing your acute and chronic conditions during your regular appointments.  Please note that Annual Wellness Visits do not include a physical exam. Some assessments may be limited, especially if the visit was conducted virtually. If needed, we may recommend a separate in-person follow-up with your provider.  Ongoing Care Seeing your primary care provider every 3 to 6 months helps us  monitor your health and provide consistent, personalized care.   Referrals If a referral was made during today's visit and you haven't received any updates within two weeks, please contact the referred provider directly to check on the status.  Recommended Screenings:  Health Maintenance  Topic Date Due   Medicare Annual Wellness Visit  07/01/2023   Flu Shot  03/02/2024   COVID-19 Vaccine (4 - 2025-26 season) 04/02/2024   Zoster (Shingles) Vaccine (1 of 2) 05/12/2024*   DTaP/Tdap/Td vaccine (2 - Td or Tdap) 06/08/2025   Pneumococcal Vaccine for age over 33  Completed   Meningitis B Vaccine  Aged Out  *Topic was postponed. The date shown is not the original due date.       05/10/2024    1:10 PM  Advanced Directives  Does Patient Have a Medical Advance Directive? No   Advance Care Planning is important because it: Ensures you receive medical care that aligns with your values, goals, and preferences. Provides guidance to your family and loved ones, reducing the emotional burden of decision-making during critical moments.  Vision: Annual vision screenings are  recommended for early detection of glaucoma, cataracts, and diabetic retinopathy. These exams can also reveal signs of chronic conditions such as diabetes and high blood pressure.  Dental: Annual dental screenings help detect early signs of oral cancer, gum disease, and other conditions linked to overall health, including heart disease and diabetes.  Please see the attached documents for additional preventive care recommendations.

## 2024-07-16 ENCOUNTER — Ambulatory Visit: Admitting: Cardiology

## 2024-07-20 ENCOUNTER — Ambulatory Visit: Admitting: Cardiology

## 2024-08-10 ENCOUNTER — Ambulatory Visit (INDEPENDENT_AMBULATORY_CARE_PROVIDER_SITE_OTHER): Payer: Self-pay | Admitting: Nurse Practitioner

## 2024-08-10 ENCOUNTER — Encounter: Payer: Self-pay | Admitting: Nurse Practitioner

## 2024-08-10 VITALS — BP 134/72 | HR 57 | Temp 97.5°F | Ht 73.0 in | Wt 133.0 lb

## 2024-08-10 DIAGNOSIS — I10 Essential (primary) hypertension: Secondary | ICD-10-CM

## 2024-08-10 DIAGNOSIS — M8588 Other specified disorders of bone density and structure, other site: Secondary | ICD-10-CM

## 2024-08-10 DIAGNOSIS — N401 Enlarged prostate with lower urinary tract symptoms: Secondary | ICD-10-CM | POA: Diagnosis not present

## 2024-08-10 DIAGNOSIS — Z125 Encounter for screening for malignant neoplasm of prostate: Secondary | ICD-10-CM | POA: Diagnosis not present

## 2024-08-10 DIAGNOSIS — E782 Mixed hyperlipidemia: Secondary | ICD-10-CM | POA: Diagnosis not present

## 2024-08-10 DIAGNOSIS — Z8673 Personal history of transient ischemic attack (TIA), and cerebral infarction without residual deficits: Secondary | ICD-10-CM | POA: Diagnosis not present

## 2024-08-10 DIAGNOSIS — N138 Other obstructive and reflux uropathy: Secondary | ICD-10-CM | POA: Diagnosis not present

## 2024-08-10 DIAGNOSIS — R350 Frequency of micturition: Secondary | ICD-10-CM | POA: Diagnosis not present

## 2024-08-10 DIAGNOSIS — I2510 Atherosclerotic heart disease of native coronary artery without angina pectoris: Secondary | ICD-10-CM

## 2024-08-10 DIAGNOSIS — R351 Nocturia: Secondary | ICD-10-CM

## 2024-08-10 MED ORDER — PRAVASTATIN SODIUM 80 MG PO TABS: 80.0000 mg | ORAL_TABLET | Freq: Every day | ORAL | 1 refills | Status: AC

## 2024-08-10 MED ORDER — FINASTERIDE 5 MG PO TABS: 5.0000 mg | ORAL_TABLET | Freq: Every day | ORAL | 1 refills | Status: AC

## 2024-08-10 MED ORDER — CLOPIDOGREL BISULFATE 75 MG PO TABS: 75.0000 mg | ORAL_TABLET | Freq: Every day | ORAL | 1 refills | Status: AC

## 2024-08-10 MED ORDER — AMLODIPINE BESYLATE 5 MG PO TABS: 5.0000 mg | ORAL_TABLET | Freq: Every day | ORAL | 1 refills | Status: AC

## 2024-08-10 NOTE — Patient Instructions (Signed)

## 2024-08-10 NOTE — Progress Notes (Signed)
 "  Subjective:    Patient ID: Stephen Cross, male    DOB: August 21, 1940, 84 y.o.   MRN: [id number]   Chief Complaint: medical management of chronic issues     HPI:  Stephen Cross is a 32 y.o. who identifies as a male who was assigned male at birth.   Social history: Lives with: wife Work history: retired from [employer] in today for follow up of the following chronic medical issues:  1. Essential hypertension Denies chest pain, sob, or headaches. Rarely checks BP at home. Blood pressure has been running low so wife decreased amlodipine  to 1/2 tablet daily. BP Readings from Last 3 Encounters:  05/10/24 (!) 131/56  02/10/24 (!) 131/56  08/15/23 (!) 145/70    2. Chronic coronary artery disease Has not seen cardiology in a while; does not have a f/u scheduled and does not want to unless he has issues.  3. Mixed hyperlipidemia Tries to watch his diet; stays active. Lab Results  Component Value Date   CHOL 111 08/15/2023   HDL 48 08/15/2023   LDLCALC 46 08/15/2023   TRIG 86 08/15/2023   CHOLHDL 2.3 08/15/2023    4. Osteopenia of lumbar spine Last DEXA done 07/16/21 with t-score of -1.9; occasionally does weight bearing exercises. Refuses repeat dexascan  5. Benign prostatic hyperplasia with nocturia States he does well on Proscar . Denies any hematuria since he saw urology in 08/31/22.   New complaints: None today   Allergies  Allergen Reactions   Codeine Nausea Only and Rash   Lisinopril Cough and Other (See Comments)   Outpatient Encounter Medications as of 08/10/2024  Medication Sig   amLODipine  (NORVASC ) 10 MG tablet Take 1 tablet (10 mg total) by mouth daily.   clopidogrel  (PLAVIX ) 75 MG tablet Take 1 tablet (75 mg total) by mouth daily.   finasteride  (PROSCAR ) 5 MG tablet Take 1 tablet (5 mg total) by mouth daily.   meclizine  (ANTIVERT ) 25 MG tablet Take 1 tablet (25 mg total) by mouth 3 (three) times daily as needed for dizziness.   pravastatin  (PRAVACHOL ) 80  MG tablet Take 1 tablet (80 mg total) by mouth daily.   No facility-administered encounter medications on file as of 08/10/2024.    Past Surgical History:  Procedure Laterality Date   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; Surgeon: Rosalynn JONETTA Dines, MD; Location: [facility] MAIN OR; Service: Cardiothoracic; Laterality: N/A; Coronary Artery Bypass Graft x4 Utilizing Left Internal Mammary Artery and Left Saphenous Vein by Open Procedure   HERNIA REPAIR     x 2    Family History  Problem Relation Age of Onset   Heart failure Mother    Obesity Sister    Heart disease Sister       Controlled substance contract: n/a     Review of Systems  Constitutional:  Negative for activity change, appetite change, fatigue and unexpected weight change.  HENT:  Negative for ear pain and sinus pressure.   Eyes:  Negative for pain.  Respiratory:  Negative for cough, chest tightness and shortness of breath.   Cardiovascular:  Negative for chest pain, palpitations and leg swelling.  Gastrointestinal:  Negative for abdominal pain and blood in stool.  Endocrine: Negative for cold intolerance, heat intolerance, polydipsia and polyphagia.  Genitourinary:  Negative for decreased urine volume, dysuria and hematuria.  Musculoskeletal:  Negative for arthralgias, joint swelling and myalgias.  Skin:  Negative for color  change and rash.  Neurological:  Negative for syncope, weakness and headaches.  Hematological:  Negative for adenopathy.  All other systems reviewed and are negative.      Objective:   Physical Exam Vitals and nursing note reviewed.  Constitutional:      General: He is not in acute distress.    Appearance: Normal appearance. He is not ill-appearing.  HENT:     Head: Normocephalic and atraumatic.     Right Ear: Tympanic membrane normal.     Left Ear: Tympanic membrane normal.     Nose: Nose normal.     Mouth/Throat:     Mouth: Mucous membranes are  moist.     Pharynx: Oropharynx is clear.  Eyes:     Conjunctiva/sclera: Conjunctivae normal.     Pupils: Pupils are equal, round, and reactive to light.  Cardiovascular:     Rate and Rhythm: Normal rate and regular rhythm.     Pulses: Normal pulses.     Heart sounds: Normal heart sounds.  Pulmonary:     Effort: Pulmonary effort is normal. No respiratory distress.     Breath sounds: Normal breath sounds. No wheezing, rhonchi or rales.  Abdominal:     General: Abdomen is flat. Bowel sounds are normal. There is no distension.     Palpations: Abdomen is soft. There is no mass.     Tenderness: There is no abdominal tenderness.     Hernia: No hernia is present.  Musculoskeletal:        General: Normal range of motion.     Cervical back: Normal range of motion. No rigidity or tenderness.     Right lower leg: No edema.     Left lower leg: No edema.  Lymphadenopathy:     Cervical: No cervical adenopathy.  Skin:    General: Skin is warm and dry.     Capillary Refill: Capillary refill takes 2 to 3 seconds.  Neurological:     General: No focal deficit present.     Mental Status: He is alert and oriented to person, place, and time.  Psychiatric:        Mood and Affect: Mood normal.        Behavior: Behavior normal.      BP 134/72   Pulse (!) 57   Temp (!) 97.5 F (36.4 C) (Temporal)   Ht 6' 1 (1.854 m)   Wt 133 lb (60.3 kg)   SpO2 91%   BMI 17.55 kg/m          Assessment & Plan:   Stephen Cross comes in today with chief complaint of medical management of chronic issues    Diagnosis and orders addressed:  1. Essential hypertension Low sodium diet Continue to keep a check of blood pressure at home As long as blood pressure stays below 140 systiolic- continue 1/2 of amlodipine . - CBC with Differential/Platelet - CMP14+EGFR - amLODipine  (NORVASC ) 10 MG tablet; Take 1 tablet (10 mg total) by mouth daily.  Dispense: 90 tablet; Refill: 1  2. Chronic coronary artery  disease Keep follow up with cardiology  3. Mixed hyperlipidemia Low fat diet - Lipid panel - pravastatin  (PRAVACHOL ) 80 MG tablet; Take 1 tablet (80 mg total) by mouth daily.  Dispense: 90 tablet; Refill: 1  4. Osteopenia of lumbar spine Weight bearing exercise Will do dexascan today  5. Benign prostatic hyperplasia with nocturia Report any voiding issues  6. History of stroke - clopidogrel  (PLAVIX ) 75 MG tablet; Take 1  tablet (75 mg total) by mouth daily.  Dispense: 90 tablet; Refill: 1  7. Lung nodules Patient declines chest CT   Labs pending Health Maintenance reviewed Diet and exercise encouraged  Follow up plan: 6 months   Mary-Margaret Gladis, FNP  "

## 2024-08-11 LAB — CMP14+EGFR
ALT: <5 IU/L (ref 0–44)
AST: 13 IU/L (ref 0–40)
Albumin: 4.7 g/dL (ref 3.7–4.7)
Alkaline Phosphatase: 92 IU/L (ref 48–129)
BUN/Creatinine Ratio: 20 (ref 10–24)
BUN: 21 mg/dL (ref 8–27)
Bilirubin Total: 0.3 mg/dL (ref 0.0–1.2)
CO2: 22 mmol/L (ref 20–29)
Calcium: 9.4 mg/dL (ref 8.6–10.2)
Chloride: 102 mmol/L (ref 96–106)
Creatinine, Ser: 1.05 mg/dL (ref 0.76–1.27)
Globulin, Total: 2.6 g/dL (ref 1.5–4.5)
Glucose: 120 mg/dL — ABNORMAL HIGH (ref 70–99)
Potassium: 4.6 mmol/L (ref 3.5–5.2)
Sodium: 141 mmol/L (ref 134–144)
Total Protein: 7.3 g/dL (ref 6.0–8.5)
eGFR: 70 mL/min/1.73

## 2024-08-11 LAB — CBC WITH DIFFERENTIAL/PLATELET
Basophils Absolute: 0.1 x10E3/uL (ref 0.0–0.2)
Basos: 1 %
EOS (ABSOLUTE): 1.2 x10E3/uL — ABNORMAL HIGH (ref 0.0–0.4)
Eos: 12 %
Hematocrit: 39.1 % (ref 37.5–51.0)
Hemoglobin: 10.4 g/dL — ABNORMAL LOW (ref 13.0–17.7)
Immature Grans (Abs): 0.1 x10E3/uL (ref 0.0–0.1)
Immature Granulocytes: 1 %
Lymphocytes Absolute: 1.7 x10E3/uL (ref 0.7–3.1)
Lymphs: 17 %
MCH: 18.2 pg — ABNORMAL LOW (ref 26.6–33.0)
MCHC: 26.6 g/dL — ABNORMAL LOW (ref 31.5–35.7)
MCV: 69 fL — ABNORMAL LOW (ref 79–97)
Monocytes Absolute: 0.8 x10E3/uL (ref 0.1–0.9)
Monocytes: 8 %
Neutrophils Absolute: 6.2 x10E3/uL (ref 1.4–7.0)
Neutrophils: 61 %
Platelets: 416 x10E3/uL (ref 150–450)
RBC: 5.71 x10E6/uL (ref 4.14–5.80)
RDW: 16.2 % — ABNORMAL HIGH (ref 11.6–15.4)
WBC: 10.1 x10E3/uL (ref 3.4–10.8)

## 2024-08-11 LAB — LIPID PANEL
Chol/HDL Ratio: 3 ratio (ref 0.0–5.0)
Cholesterol, Total: 126 mg/dL (ref 100–199)
HDL: 42 mg/dL
LDL Chol Calc (NIH): 54 mg/dL (ref 0–99)
Triglycerides: 179 mg/dL — ABNORMAL HIGH (ref 0–149)
VLDL Cholesterol Cal: 30 mg/dL (ref 5–40)

## 2024-08-11 LAB — PSA, TOTAL AND FREE
PSA, Free Pct: 32 %
PSA, Free: 0.64 ng/mL
Prostate Specific Ag, Serum: 2 ng/mL (ref 0.0–4.0)

## 2024-08-13 ENCOUNTER — Ambulatory Visit: Payer: Self-pay | Admitting: Nurse Practitioner

## 2024-09-04 ENCOUNTER — Ambulatory Visit: Payer: Self-pay

## 2024-09-04 NOTE — Telephone Encounter (Signed)
 FYI Only or Action Required?: Action required by provider: clinical question for provider.  Patient was last seen in primary care on 08/10/2024 by Gladis Mustard, FNP.  Called Nurse Triage reporting Rib Injury.  Symptoms began several days ago.  Interventions attempted: Prescription medications: Hydrocodone -acetaminophen  .  Symptoms are: unchanged.  Triage Disposition: Home Care (overriding See Physician Within 24 Hours)  Patient/caregiver understands and will follow disposition?: No, wishes to speak with PCP   Reason for Disposition  [1] MODERATE pain (e.g., interferes with normal activities) AND [2] high-risk adult (e.g., age > 60 years, osteoporosis, chronic steroid use)  Answer Assessment - Initial Assessment Questions Pt's wife Avelina calling in today. Patient seen in ED 09/03/24, denies new or worsening symptoms., prescribed HYDROcodone -acetaminophen  (NORCO) 5-325 mg per tablet. 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 Looking for something different. Requesting call back, [phone]  as soon as possible, does not want to come in for OV due to pain. please advise.   1. MECHANISM: How did the injury happen?     Fell on ice  2. ONSET: When did the injury happen? (.e.g., minutes, hours, days ago)     08/31/24  3. LOCATION: Where on the chest is the injury located?      Nondisplaced or minimally displaced fractures of the posterolateral left 5th, 7th and 8th ribs.  4. PAIN: Is there pain? If Yes, ask: How bad is the pain? (e.g., Scale 0-10; none, mild, moderate, severe)     10/10, miserable, not sleeping  Protocols used: Chest Injury-A-AH  Message from Alfonso ORN sent at 09/04/2024  1:17 PM EST  Summary: Fall with injury in severe pain   Reason for Triage: 08/31/24 fell on ice and broke 3 ribs and  in severe pain , was given was given oxycodone   and the medication is not helping and beside do not want to  take since it can be habit forming  Patient wanting to get another pain medication Spouse patricia Hyams called on behalf of patient and patient is in the background

## 2024-09-07 ENCOUNTER — Telehealth: Admitting: Nurse Practitioner

## 2024-09-07 ENCOUNTER — Ambulatory Visit: Payer: Self-pay | Admitting: Nurse Practitioner

## 2024-09-07 ENCOUNTER — Encounter: Payer: Self-pay | Admitting: Nurse Practitioner

## 2024-09-07 DIAGNOSIS — S2231XA Fracture of one rib, right side, initial encounter for closed fracture: Secondary | ICD-10-CM

## 2024-09-07 MED ORDER — HYDROCODONE-ACETAMINOPHEN 5-325 MG PO TABS: 1.0000 | ORAL_TABLET | Freq: Three times a day (TID) | ORAL | 0 refills | Status: AC | PRN

## 2024-09-07 MED ORDER — HYDROCODONE-ACETAMINOPHEN 5-325MG PREPACK (~~LOC~~: ORAL_TABLET | ORAL | 0 refills | Status: DC
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
## 2025-02-07 ENCOUNTER — Ambulatory Visit: Admitting: Nurse Practitioner

## 2025-05-13 ENCOUNTER — Ambulatory Visit: Payer: Self-pay
